# Patient Record
Sex: Female | Born: 1990 | Race: White | Marital: Married | Smoking: Never smoker
Health system: Northeastern US, Academic
[De-identification: ages and names within clinical notes are randomized; demographics above are authoritative.]

## PROBLEM LIST (undated history)

## (undated) DIAGNOSIS — D649 Anemia, unspecified: Secondary | ICD-10-CM

## (undated) DIAGNOSIS — B019 Varicella without complication: Secondary | ICD-10-CM

## (undated) HISTORY — DX: Anemia, unspecified: D64.9

## (undated) HISTORY — DX: Varicella without complication: B01.9

---

## 2010-04-09 DIAGNOSIS — Z3490 Encounter for supervision of normal pregnancy, unspecified, unspecified trimester: Secondary | ICD-10-CM

## 2010-04-09 DIAGNOSIS — O219 Vomiting of pregnancy, unspecified: Secondary | ICD-10-CM

## 2010-04-09 HISTORY — DX: Vomiting of pregnancy, unspecified: O21.9

## 2010-04-09 HISTORY — DX: Encounter for supervision of normal pregnancy, unspecified, unspecified trimester: Z34.90

## 2010-04-22 ENCOUNTER — Ambulatory Visit: Payer: Self-pay | Admitting: Obstetrics and Gynecology

## 2010-04-22 ENCOUNTER — Ambulatory Visit
Admit: 2010-04-22 | Discharge: 2010-04-22 | Disposition: A | Discharge status: Home / Self Care | Payer: Self-pay | Source: Ambulatory Visit | Attending: Obstetrics and Gynecology | Admitting: Obstetrics and Gynecology

## 2010-04-22 DIAGNOSIS — Z789 Other specified health status: Secondary | ICD-10-CM

## 2010-04-22 DIAGNOSIS — L409 Psoriasis, unspecified: Secondary | ICD-10-CM

## 2010-04-22 DIAGNOSIS — Z141 Cystic fibrosis carrier: Secondary | ICD-10-CM

## 2010-04-22 HISTORY — DX: Cystic fibrosis carrier: Z14.1

## 2010-04-22 HISTORY — DX: Psoriasis, unspecified: L40.9

## 2010-04-22 HISTORY — DX: Other specified health status: Z78.9

## 2010-04-22 LAB — TYPE AND SCREEN FOR PNP
ABO RH Blood Type: A POS
Antibody Screen: NEGATIVE

## 2010-04-22 LAB — TSH: TSH: 0.55 u[IU]/mL (ref 0.27–4.20)

## 2010-04-22 LAB — T4, FREE: Free T4: 1.4 ng/dL (ref 0.9–1.7)

## 2010-04-23 LAB — LEAD, BLOOD

## 2010-04-23 LAB — HEMOGLOBIN ELECTROPHORESIS
Hemoglobin Result: NORMAL
Hgb A1: 97.1 % (ref 96.8–97.8)
Hgb A2: 2.9 % (ref 2.2–3.2)

## 2010-04-23 LAB — DRUG SCREEN CHEMICAL DEPENDENCY, URINE
Amphetamine,UR: NEGATIVE
Benzodiazepinen,UR: NEGATIVE
Cocaine/Metab,UR: NEGATIVE
Opiates,UR: NEGATIVE
THC Metabolite,UR: NEGATIVE

## 2010-04-23 LAB — AEROBIC CULTURE: Aerobic Culture: 0

## 2010-04-23 LAB — LEAD VENOUS: Lead,Venous: <1 ug/dL (ref 0–25)

## 2010-04-23 LAB — HEPATITIS B SURFACE ANTIGEN: HBV S Ag: NEGATIVE

## 2010-04-23 LAB — HIV-1 AND 2 AB: HIV 1&2 Ab screen: NEGATIVE

## 2010-04-24 LAB — OBSTETRICS PANEL
Baso # K/uL: 0 THOU/uL (ref 0.0–0.1)
Basophil %: 0.3 % (ref 0.1–1.2)
Eos # K/uL: 0.1 THOU/uL (ref 0.0–0.4)
Eosinophil %: 0.7 % (ref 0.7–5.8)
Hematocrit: 35 % (ref 34–45)
Hemoglobin: 11.8 g/dL (ref 11.2–15.7)
Lymph # K/uL: 1.9 THOU/uL (ref 1.2–3.7)
Lymphocyte %: 26.7 % (ref 19.3–51.7)
MCV: 88 fL (ref 79–95)
Mono # K/uL: 0.6 THOU/uL (ref 0.2–0.9)
Monocyte %: 7.8 % (ref 4.7–12.5)
Neut # K/uL: 4.7 THOU/uL (ref 1.6–6.1)
Platelets: 231 THOU/uL (ref 160–370)
RBC: 4 MIL/uL (ref 3.9–5.2)
RDW: 12.4 % (ref 11.7–14.4)
RPR Screen: NONREACTIVE
Seg Neut %: 64.5 % (ref 34.0–71.1)
WBC: 7.2 THOU/uL (ref 4.0–10.0)

## 2010-04-24 LAB — TOXOPLASMA GONDII ANTIBODY, IGG: Toxoplasma IgG: NEGATIVE

## 2010-05-01 LAB — CYSTIC FIBROSIS DNA (BAYLOR)

## 2010-05-09 ENCOUNTER — Ambulatory Visit: Payer: Self-pay

## 2010-05-20 ENCOUNTER — Other Ambulatory Visit: Payer: Self-pay | Admitting: Obstetrics and Gynecology

## 2010-05-20 ENCOUNTER — Inpatient Hospital Stay: Admit: 2010-05-20 | Discharge: 2010-05-20 | Disposition: A | Discharge status: Home / Self Care | Payer: Self-pay

## 2010-05-20 ENCOUNTER — Ambulatory Visit
Admit: 2010-05-20 | Discharge: 2010-05-20 | Disposition: A | Discharge status: Home / Self Care | Payer: Self-pay | Source: Ambulatory Visit | Admitting: Obstetrics and Gynecology

## 2010-05-20 ENCOUNTER — Ambulatory Visit
Admit: 2010-05-20 | Discharge: 2010-05-20 | Disposition: A | Discharge status: Home / Self Care | Payer: Self-pay | Source: Ambulatory Visit | Attending: Obstetrics and Gynecology | Admitting: Obstetrics and Gynecology

## 2010-05-20 ENCOUNTER — Ambulatory Visit: Payer: Self-pay | Admitting: Obstetrics and Gynecology

## 2010-05-20 DIAGNOSIS — K5900 Constipation, unspecified: Secondary | ICD-10-CM

## 2010-05-20 HISTORY — DX: Constipation, unspecified: K59.00

## 2010-05-21 LAB — N. GONORRHOEAE DNA AMPLIFICATION: N. gonorrhoeae DNA Amplification: 0

## 2010-05-21 LAB — AEROBIC CULTURE: Aerobic Culture: 0

## 2010-05-21 LAB — CHLAMYDIA PLASMID DNA AMPLIFICATION: Chlamydia Plasmid DNA Amplification: 0

## 2010-05-22 LAB — MATERNAL 1ST TRIMESTER SCR (11-13 6/7 WEEKS)
Age at Delivery: 22.3 yrs.
CRL: 57.3 mm
DS A Priori Risk: 1:819 {titer}
DS Screen Risk: 1:3290 {titer}
HCG MoM: 1.55
NT MoM: 0.88
NT: 1.2 mm
PAPP-A MoM: 0.82
PAPP-A: 1.69 m[IU]/mL
Patient Weight: 116 [lb_av]
T18 A Priori Risk: 1:2040 {titer}
T18 Screen Risk: 1:97900 {titer}
hCG: 164726 m[IU]/mL

## 2010-05-24 LAB — GYN CYTOLOGY

## 2010-06-09 NOTE — L&D Delivery Note (Addendum)
Delivery Summary Note  Patient: Carrie Velazquez  Age: 20 y.o.  Date of Birth: 01-Aug-1990  ZOX:[id number]  MRN: [id number]  Admission Summary  Date and time of admission: 12/05/2010  7:25 PM Attending Provider: Dalene Seltzer, MD Provider Group : [facility]  Active Hospital Problems   Diagnoses   . Normal pregnancy     Allergies:   Review of patient's allergies indicates no known allergies.  Weight:  PrePregnancy Weight: 51.71 kg (114 lb) Weight: 67.586 kg (149 lb) Pregnancy weight change (kg): 15.88 kg   Obstetric History    G1   P1   T1   P0   A0   TAB0   SAB0   E0   M0   L1       Breast or Bottle Feeding: Breast feeding           Prenatal Labs  ABO RH Blood Type   Date Value Range Status   12/05/2010 A RH POS   Final        Antibody Screen   Date Value Range Status   12/05/2010 Negative   Final        Rubella IgG AB   Date Value Range Status   04/22/2010 EQUIVCL  IMMUNE (no units) Final      TEST METHOD: EIA        RPR Screen   Date Value Range Status   04/22/2010 NONREACT  NONREACT (no units) Final      TEST METHOD: Charcoal Particle Agglutination        HBV S Ag   Date Value Range Status   04/22/2010 NEG   Final        Group B Strep Culture   Date Value Range Status   11/08/2010 .   Final        HIV 1&2 Ab screen   Date Value Range Status   04/22/2010 NEG   Final      TEST METHOD: EIA      Dating Information  Patient's last menstrual period was 02/26/2010. EDD: 12/03/2010, by Last Menstrual Period  Labor Onset               Information for the patient's newborn:  Jaki, Reves Girl [[id number]]   Delivery:   Patient: Girl Hyacinth Meeker  Sex: female Gestational Age: 69.4 weeks. MRN: [id number] Levin Erp, MD, MD *     Birth date [birth year] Time 12:35 PM      Newborn Measurements  Weight: 3561 g (7 lb 13.6 oz)  Height: 20.5"  Head circumference: 13.5 cm  *Presentation     Presentation: Vertex Position: Right Occiput Anterior        *Delivering Personnel     Delivering clinician: Rich Reining    Other personnel:      Provider Role    Liz Malady  Delivery Nurse    Joaquin Music, JACLYN Delivery Assist    Wanita Chamberlain A Registered Nurse    Corinne Ports Delivery Assist           *Delivery (Newborn)      Delivery type: Vaginal, Spontaneous Delivery                Location of delivery: labor flr        *Anesthesia     Method: Epidural, Local         *APGARs     Totals: : 8  : 9          Color:  1  1          Grimace:      2  2          Breathing:     1  2          Heart rate:    2  2          Muscle tone: 2  2          Living Status Yes                          *Resuscitation     Method: None        *Cord Info     Vessels: 3 Vessels Complications: NONE Cord blood disposition: Lab Gases sent? No        *Placenta     Date and time: 12/06/2010 12:39 PM    Removal: Spontaneous Appearance: Intact        *Stages of Labor     Stage 1  Hours: 6 Min: 39    Stage 2  Hours: 1 Min: 5    Stage 3  Hours: 0 Min: 4        *Labor Events     Preterm labor? No    Rupture date: 12/06/10 Rupture type: Spontaneous    Rupture Time  1:30 AM Fluid color: Moderate Amount, Non Particulate Meconium    Induction: None Augmentation: Oxytocin, AROM                  *Delivery (Maternal)     Episiotomy: None Repair Done: Yes    Lacerations: Vaginal, 1st Blood loss (mL): 300    Needle Count: Correct Sponge Count: Correct             20 year old G1P1001 s/p spontaneous vaginal delivery to live female infant, birth weight 7 lb 13.6 ounces (3561g) with Apgars of 8 and 9. Patient sustained first degree vaginal laceration, repaired with 3-0 vicryl and a local anesthesia. Placenta delivered spontaneously intact with 3 vessel cord. EBL 300 mL. SCN in to assess infant for respiratory support. Patient stable.    Dr. Lyndal Rainbow and Dr.Morrison-Cole were present for the entire delivery.     Corinne Ports, MD  Ob/Gyn R1  Pager #: [phone]    MFM Attending    Patient underwent spontaneous vaginal delivery without complications.  Placenta delivered spontaneously and intact. Patient and infant  stable throughout.    I was present throughout the entire procedure.    Rich Reining, MD

## 2010-06-14 ENCOUNTER — Ambulatory Visit: Payer: Self-pay | Admitting: Obstetrics and Gynecology

## 2010-06-14 ENCOUNTER — Ambulatory Visit
Admit: 2010-06-14 | Discharge: 2010-06-14 | Disposition: A | Discharge status: Home / Self Care | Payer: Self-pay | Source: Ambulatory Visit | Attending: Obstetrics and Gynecology | Admitting: Obstetrics and Gynecology

## 2010-06-16 LAB — AEROBIC CULTURE: Aerobic Culture: 0

## 2010-06-16 NOTE — Letter (Signed)
 May 10, 2010    Percell Maberry, CNM  [address]  [place], [place]  [address]    RE:   Marla, Pouliot  DOB:  [birth year]  Unit#: [id number]    Consultation Date:       05/09/2010  Site:               [facility]  ICD9 Code:          655.23  Consultation Code:      99243  Counselor/ MD:           LM/MH    Indication:    Cystic fibrosis carrier  Pregnant?   Y                   Age (at [facility], if pregnant):   43  Gestational age at time of consultation:  [redacted]w[redacted]d    Family History:  This is the first pregnancy for Ms. Dannemiller and her partner, Donnie Coffin.  Reported family histories did not reveal significant risk factors.    "X" Indicates Test Discussed         Comments  X    First trimester screening        Planned 05/20/10.        Second trimester screening      Offer at 15-18 wks for open NTD  screening.  X    Amniocentesis*             Discussed availability should Mr. Carmela Hurt  screen positive for CF.  X    Chorionic villus sampling*     Discussed availability should Mr.  Carmela Hurt screen positive for CF.  X    Hemoglobinopathy screen           Written info provided.  Please offer  if not already done.  X    Cystic fibrosis             Elected by Mr. Carmela Hurt 05/09/10.        Ashkenazi Carrier Screen        Fragile X        Blood chromosome analysis        Ultrasound        Other  * Risks, benefits, and limitations discussed, including the risk of              miscarriage.      Comments:  Ms. 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  During the counseling session, we discussed the  common clinical features and autosomal recessive inheritance pattern of  cystic fibrosis.  Given his ancestry, there is a 1 in 34 chance that Mr.  Carmela Hurt is also a cystic fibrosis carrier and therefore approximately a one  percent chance that the current pregnancy is affected.  He elected CF  carrier screening for additional  information.  The couple was informed that  if he screens negative this will significantly reduce, but not completely  eliminate, the chance for an affected child.  If he is found to carry an  identifiable cystic fibrosis mutation, prenatal diagnosis via CVS or  amniocentesis would be possible.      Results:  Results will be forwarded to your office when available.            Thank you for referring this patient to Reproductive Genetics.  Sincerely,  Dictated by:  Demetrios Isaacs, [facility]  Electronically Reviewed and Signed by  Demetrios Isaacs, [facility] 05/10/2010 18:05    Co-signature.    Electronically Signed and Finalized by  Forrestine Him, MD 05/13/2010 17:16  ____________________________________  Forrestine Him, MD          DD:   05/09/2010  DT:   05/09/2010  1:01 P  DVI:  [id number]      cc:   Percell Cacho, CNM

## 2010-07-02 ENCOUNTER — Ambulatory Visit: Payer: Self-pay | Admitting: Obstetrics and Gynecology

## 2010-07-02 ENCOUNTER — Ambulatory Visit
Admit: 2010-07-02 | Discharge: 2010-07-02 | Disposition: A | Discharge status: Home / Self Care | Payer: Self-pay | Source: Ambulatory Visit | Attending: Obstetrics and Gynecology | Admitting: Obstetrics and Gynecology

## 2010-07-03 LAB — AEROBIC CULTURE: Aerobic Culture: 0

## 2010-07-09 ENCOUNTER — Ambulatory Visit
Admit: 2010-07-09 | Discharge: 2010-07-09 | Disposition: A | Discharge status: Home / Self Care | Payer: Self-pay | Source: Ambulatory Visit | Admitting: Obstetrics and Gynecology

## 2010-07-09 ENCOUNTER — Inpatient Hospital Stay: Admit: 2010-07-09 | Discharge: 2010-07-09 | Disposition: A | Discharge status: Home / Self Care | Payer: Self-pay

## 2010-07-09 ENCOUNTER — Other Ambulatory Visit: Payer: Self-pay | Admitting: Obstetrics and Gynecology

## 2010-07-22 ENCOUNTER — Encounter: Payer: Self-pay | Admitting: Obstetrics and Gynecology

## 2010-07-25 ENCOUNTER — Other Ambulatory Visit: Payer: Self-pay | Admitting: Gynecology

## 2010-07-25 ENCOUNTER — Ambulatory Visit
Admit: 2010-07-25 | Discharge: 2010-07-25 | Disposition: A | Discharge status: Home / Self Care | Payer: Self-pay | Source: Ambulatory Visit | Admitting: Obstetrics and Gynecology

## 2010-07-25 ENCOUNTER — Inpatient Hospital Stay: Admit: 2010-07-25 | Discharge: 2010-07-25 | Disposition: A | Discharge status: Home / Self Care | Payer: Self-pay

## 2010-08-07 ENCOUNTER — Ambulatory Visit: Payer: Self-pay | Admitting: Obstetrics & Gynecology

## 2010-08-07 DIAGNOSIS — A630 Anogenital (venereal) warts: Secondary | ICD-10-CM

## 2010-08-07 HISTORY — DX: Anogenital (venereal) warts: A63.0

## 2010-08-28 ENCOUNTER — Ambulatory Visit: Payer: Self-pay | Admitting: Student in an Organized Health Care Education/Training Program

## 2010-09-11 ENCOUNTER — Ambulatory Visit: Payer: Self-pay | Admitting: Obstetrics and Gynecology

## 2010-09-11 ENCOUNTER — Ambulatory Visit
Admit: 2010-09-11 | Discharge: 2010-09-11 | Disposition: A | Discharge status: Home / Self Care | Payer: Self-pay | Source: Ambulatory Visit | Attending: Obstetrics and Gynecology | Admitting: Obstetrics and Gynecology

## 2010-09-11 LAB — GLUCOSE TOLERANCE, 1 HOUR: Glucose,50gm 1HR: 103 mg/dL (ref 63–135)

## 2010-09-11 LAB — HEMATOCRIT: Hematocrit: 30 % — ABNORMAL LOW (ref 34–45)

## 2010-09-12 LAB — SYPHILIS SCREEN
Syphilis Screen: NEGATIVE
Syphilis Status: NONREACTIVE

## 2010-10-08 ENCOUNTER — Encounter: Payer: Self-pay | Admitting: Obstetrics and Gynecology

## 2010-10-09 ENCOUNTER — Ambulatory Visit: Payer: Self-pay | Admitting: Obstetrics and Gynecology

## 2010-10-16 ENCOUNTER — Ambulatory Visit: Payer: Self-pay

## 2010-10-16 DIAGNOSIS — L409 Psoriasis, unspecified: Secondary | ICD-10-CM | POA: Insufficient documentation

## 2010-10-16 DIAGNOSIS — Z3490 Encounter for supervision of normal pregnancy, unspecified, unspecified trimester: Secondary | ICD-10-CM | POA: Insufficient documentation

## 2010-10-16 DIAGNOSIS — Z141 Cystic fibrosis carrier: Secondary | ICD-10-CM | POA: Insufficient documentation

## 2010-10-25 ENCOUNTER — Ambulatory Visit: Payer: Self-pay | Admitting: Obstetrics and Gynecology

## 2010-10-25 ENCOUNTER — Encounter: Payer: Self-pay | Admitting: Obstetrics and Gynecology

## 2010-10-25 VITALS — BP 120/60 | Wt 136.0 lb

## 2010-10-25 DIAGNOSIS — Z3490 Encounter for supervision of normal pregnancy, unspecified, unspecified trimester: Secondary | ICD-10-CM

## 2010-10-25 NOTE — Progress Notes (Signed)
 S: Here for OB visit. Doing well. +FM denies VB, LOF or CTX. No complaints today. Not taking FeSO4 or prenatal vitamins.  O: see flow sheet  A: IUP @ 34 3/7 weeks       S=D  P: Reviewed s/sx of PTL       Encouraged to take prenatal vitamins. Reviewed low Hct (30) and importance of taking FeSO4; patient agrees to take.        Patient has not decided on pediatrician; advised to do so       Plans to breastfeed; wants POPs for PPBC       RTC in 1-2 weeks

## 2010-10-25 NOTE — Patient Instructions (Signed)
 Common Discomforts: THIRD TRIMESTER    The last few weeks of your pregnancy are often the most uncomfortable.  Your growing baby is taking up more and more space.   Carrying around all that extra weight can make you more tired than usual.  As the baby and your uterus press up, down and out, other discomforts arise.     What are the most common discomforts during this last part of my pregnancy?  Most women have some or all of the following discomforts: edema (swelling), insomnia (unable to get to sleep or stay asleep), uncomfortable intercourse (sex), going to the bathroom a lot, shortness of breath and numbness or tingling in the fingers.   Discomforts you developed in your second trimester, such as leg cramps, constipation or hemorrhoids, may continue, get better or get worse.  Every woman is different and every pregnancy is different.     What can I do to prevent or relieve any discomforts?  No matter how careful you are, it is almost impossible to avoid all discomforts during your pregnancy.  Face it--carrying around a 6-10 pound baby and all that goes with it (placenta, uterus, bag of waters) in the tight space between your rib cage and your hips is bound to be a bit of a hassle!  There are many things you can do to make these last weeks easier on yourself.  This guide looks at each of most common discomforts.     Edema  Edema is swelling of any part of your body.  Your feet, ankles and hands most commonly swell during late pregnancy.  Sometimes swelling can be a sign of a problem.  (See the guide on Warning Signs-Second Trimester).  Sudden swelling, especially in your face or upper body, can be a warning sign.  Call your health care provider (HCP) right away if you have swelling in your face or above your waist.     Swelling also happens because your hormones make your blood vessels leak.  Also, the pressure of your baby on your hips sometimes keeps your blood from flowing well in your legs.  That is why your  feet and ankles may swell.  Eating normal amounts of salt on your food will not cause this, but taking salt pills or eating a lot of salt might make edema worse.    Here are some things to do that can help:   . Try not to wear tight clothing.  Tight waistbands or knee-high and thigh-high socks are especially a problem.  . Take rest periods often. Raise your legs higher than your heart if possible.  At the very least, put your feet up so they are level with your hips.  . Use support panty hose to help the blood flow in your legs  . Watch the amount of salt and salty foods in your diet.   . Call your HCP if the swelling keeps getting worse or happens very quickly.     Insomnia    Insomnia is not being able to sleep.  Sometimes people have trouble falling asleep.  Other people fall asleep, but wake up in the middle of the night and cannot get back to sleep.  Most women have changes in their sleep patterns during pregnancy.  Sometime the size of the baby makes it hard to find a comfortable position.   Pressure on your bladder may make you have to get up to go the bathroom at night.  Stress worrying about things  and being anxious can also keep you from sleeping well.    Here are some things that might help you sleep better:  . Avoid exciting activities before bedtime.   No relay races or scary movies!  . Take a warm bath.  . Try the side-lying position for rest and relaxation.  Some women prefer to support the upper leg with pillows.  . Read a dull book.  . Have someone give you a back rub or massage!  . Exercise before dinner or at least 3 hours before bedtime.   . Take a short nap during the day so you are not overly tired.  . Try not to drink things with caffeine, like coffee, tea, cola or other soda (read the labels).  . Sip on a glass of milk before bed or have a small bowl of cereal with milk.   Marland Kitchen Keep a pad by your bed and write down things that are bothering you.  Some women cannot get to sleep for fear they will  forget what they are thinking about.     If there are things that are worrying you, talk to someone about your worries.  Your HCP might be able to give you some other ideas too!    Discomforts during sex    During this last trimester, it can be more challenging to enjoy "being with" your special someone.  Lots of things make a difference in how much you enjoy sex.   Pressure from the growing baby and changes in your vagina make sex feel different.  Your larger belly can get in the way! How you feel and think can change your feelings about sex too.  It is okay to have sex, usually all the way up until your baby's birth.   You will not hurt the baby!    In some special cases, your HCP may tell you not to have sex or even sex play especially playing with nipples (this can lead to preterm labor).  Except in these special cases, though, you can enjoy sex as much as you and your partner like.        Here are some things that can help make you more comfortable:  Marland Kitchen Try different positions.  Lying on your side of often helps.  . Use a water soluble vagina gel, like K-Y Jelly, to keep things slippery.   Do not use petroleum jelly or mineral oil.  Look for water soluble on the package.  . Talk about any fears or concerns you have with your partner or your HCP.   . Tell your HCP if you have any symptoms of a vaginal infection (itching, burning, pain with sex, strange discharge).  Be sure to follow all instructions for treatment.   . Find other ways to express your feelings.  Try new ways to give your partner pleasure if the sex act itself gets too uncomfortable.  See if there are ways he can make you feel better without sex too.    Frequent urination    You may find yourself having to go to the bathroom a lot more often now.  The baby's head is pushing your bladder (urine storage area).  Your kidneys are also making more urine these days, because you have more blood flowing through them.    Sometimes you develop a urinary  infection.   If it happens, you will also find yourself    urinating more often.  But you may also notice burning  when you go and your urine may be cloudy or smell different than usual.  If any of these things happen, tell your HCP.  There are medicines that can help get rid of the infection and make you feel better.     Things you can do to be more comfortable are:  . Drink fluids often.  Do not cut back on fluids.  Do not drink all your liquids for 1 day at the same time.  Try carrying a large cup of water with you and sip on it often.   . Do not drink liquids with caffeine in them.  Too much caffeine may not be good for the baby.  Caffeine also makes you have to urinate more often.  Coffee and tea have caffeine--even the decaffeinated kind has a little.   Colas and other dark sodas often have caffeine.  Even some "clear" sodas have caffeine, so read the labels to see if caffeine is listed in the ingredients.  . Do Kegel exercises.   Squeeze the muscles in your bottom, then release.   Try to do 10 squeezes every time you think of it.   This will make it easier for you to hold your urine until you can get to a bathroom.    Shortness of breath    A feeling of having a hard time catching your breath occurs in this last trimester.  This is caused by the baby getting bigger and pushing up on your ribs and lungs.  This makes it hard for your lungs to stretch and let you take a deep breath.  Sometimes other problems can make this feeling worse.   If you have a cold with a fever, or if you have had other problems with your heart and lungs, tell your HCP if you get short of breath.  Otherwise here are some ways to breath easier:  . Do not do anything that you find makes you short of breath!  Try not to bend over for long periods.  Pace your exercise and walking so you do not have trouble catching your breath.  Take stairs more slowly.  . Do not wear clothing that is too tight.  Make sure you get larger bras and blouses as  you chest gets larger.   . If lying down makes it harder to breathe, add extra pillows under your head and back at night.   . Split up your activities to include more rest breaks.     Numbness or tingling

## 2010-11-08 ENCOUNTER — Ambulatory Visit: Payer: Self-pay | Admitting: Obstetrics and Gynecology

## 2010-11-08 ENCOUNTER — Encounter: Payer: Self-pay | Admitting: Obstetrics and Gynecology

## 2010-11-08 VITALS — BP 110/70 | Wt 143.0 lb

## 2010-11-08 DIAGNOSIS — Z3490 Encounter for supervision of normal pregnancy, unspecified, unspecified trimester: Secondary | ICD-10-CM

## 2010-11-08 NOTE — Progress Notes (Signed)
 Subjective:      Carrie Velazquez is a 20 y.o. female being seen today for her obstetrical visit. She is at [redacted]w[redacted]d gestation. Patient reports no bleeding, no leaking and occasional contractions. Fetal movement: normal. 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    Menstrual History:  OB History     Grav Para Term Preterm Abortions TAB SAB Ect Mult Living    1                  Patient's last menstrual period was 02/26/2010.       Patient's medications, allergies, past medical, surgical, social and family histories were reviewed and updated as appropriate.    Review of Systems  A comprehensive review of systems was negative.     Objective:      BP 110/70  Wt 143 lb (64.864 kg)  LMP 02/26/2010  FHT: 143 BPM   Uterine Size: 33 cm and size less than dates   Presentations: vertex           Assessment:       Carrie Velazquez is a  20 y.o., G1P0 presenting for routine OBC at [redacted]w[redacted]d by LMP, early ultrasound with pregnancy complicated by the following obstetrical risks: anemia (Hct 30)     Plan:    Supplementation:Prenatal vitamins and Iron; Patient stated she is not currently taking iron. Advised to start taking BID as HCT is 30.  Counseling:Third trimester completed  Labs:GC, CT and GBS or GBSS  Imaging: ultrasound to check fetal growth S<D  Gender: Female  Feeding:breast  Contraception plan:OCP (estrogen/progesterone)  Follow up in 1 Week.

## 2010-11-08 NOTE — Patient Instructions (Signed)
 Signs of Labor                                 Is there any way to predict when I'm going to go into labor?  Not really. Experts don't fully understand what triggers the onset of labor, and there's no way to predict exactly when it will start. Your body actually starts preparing for labor up to a month before you give birth. You may be blissfully unaware of what's going on -- or you may begin to notice new symptoms as your due date draws near. Here are some things that may happen in the weeks or days before labor starts:    Your baby "drops."  If this is your first pregnancy, you may feel what's known as "lightening" a few weeks before labor starts. You might detect heaviness in your pelvis as this happens and notice less pressure just below your ribcage, making it easier to catch your breath.    You note more Braxton Hicks contractions.  More frequent and intense Braxton Hicks contractions can signal pre-labor, during which your cervix ripens (see below) and the stage is set for true labor. Some women experience a crampy, menstrual-like feeling during this time.  Sometimes, as true labor draws near, [facility] contractions become relatively painful and strike as often as every ten to 20 minutes, making you wonder whether true labor has started. But if the contractions don't get longer, stronger, and closer together and cause your cervix to dilate progressively, then what you're feeling is probably so-called false labor.    Your cervix starts to ripen.  In the days and weeks before delivery, Braxton Hicks contractions may do the preliminary work of softening, thinning, and perhaps opening your cervix a bit. (If you've given birth before, your cervix is more likely to dilate a centimeter or two before labor starts, but keep in mind that even being [redacted] weeks pregnant with your first baby and 1 centimeter dilated is no guarantee that labor is imminent.)  When you're at or near your due date, your practitioner may do  a vaginal exam during your prenatal visit to see whether your cervix has started to change.    You pass your mucus plug or notice "bloody show."  You may pass your mucus plug -- the small amount of thickened mucus that has sealed your cervical canal during the last nine months -- if your cervix begins to dilate as you get close to labor.    The plug may come out in a lump or as increased vaginal discharge over the course of several days. The mucus may be tinged with brown, pink, or red blood, which is why it's referred to as "bloody show." Having sex or a vaginal exam can also disturb your mucus plug and cause you to see some blood-tinged discharge, even when labor isn't going to start in the next few days.    Your water breaks.  When the fluid-filled amniotic sac surrounding your baby ruptures, fluid leaks from your vagina. And whether it comes out in a large gush or a small trickle, you should call your doctor or midwife.    Most women start having regular contractions before their water breaks, but in some cases, the water breaks first. When this happens, labor usually follows soon. If you don't start having contractions on your own within a certain amount of time, you'll need to be  induced, since your baby's more likely to get an infection without the amniotic sac's protection against germs.    How can I tell whether my labor has actually started?  It's often not possible to pinpoint exactly when "true" labor begins because early labor contractions might start out feeling like the Braxton Hicks contractions you may have been noticing for weeks.    It's likely that labor is under way, however, when your contractions become increasingly longer, stronger, and closer together. They may be as far apart as every ten minutes or so in the beginning, but they won't stop or ease up no matter what you do. And in time, they'll become more painful and closer together.    In some cases, though, the onset of strong, regular  contractions comes with little or no warning. It's different for every woman and with every pregnancy.    When should I call my doctor or midwife?   Toward the end of your pregnancy, your practitioner should give you a clear set of guidelines for when to let her know that you're having contractions and at what point she'll want you to go to the hospital or birth center.  These instructions will depend on your individual situation -- whether you have pregnancy complications or are otherwise considered high-risk, whether this is your first baby, and practical matters like how far you live from the hospital or birth center -- as well as on your caregiver's personal preference (some prefer an early heads-up).  If your pregnancy is uncomplicated, she'll probably have you wait to come in until you've been having contractions that last for about a minute each, coming every five minutes for about an hour. (You time a contraction from the beginning of one to the beginning of the next one.) As a rule, if you're high-risk, she'll want to hear from you earlier in labor.  Don't be afraid to call if the signs aren't clear but you think the time may have come. Doctors and midwives are used to getting calls from women who aren't sure whether they're in labor and need guidance. It's part of their job.  And the truth is, your caregiver can tell a lot by the sound of your voice, so verbal communication helps. She'll want to know how close together your contractions are, how long each one lasts, how strong they are (she'll note whether you can talk through a contraction), and any other symptoms you may have.  Finally, whether or not your pregnancy has been problem-free up to now, and whether or not you think you might be in labor, be sure to call your caregiver right away (and if you can't reach her, head for the hospital) in the following situations:     Your water breaks or you suspect that you're leaking amniotic fluid. Tell your  practitioner if it's yellow, brown, or greenish, because this signals the presence of meconium, your baby's first stool, which is sometimes a sign of fetal stress. It's also important to let her know if the fluid looks bloody.     You notice that your baby is less active.     You have vaginal bleeding (unless it's just bloody show -- mucus with a spot or streak of blood), constant severe abdominal pain, or fever.     You start having contractions before 37 weeks or any other signs of labor. You have severe or persistent headaches, vision changes, intense pain or tenderness in your upper abdomen, abnormal swelling, or  any other symptoms of preeclampsia.    Some women assume that various symptoms are just part and parcel of being pregnant, while others worry that every new symptom spells trouble. Knowing which pregnancy symptoms you should never ignore can help you decide when to call your caregiver.    That said, every pregnancy is different and no list can cover all situations, so if you're not sure whether a symptom is serious, or if you just don't feel like yourself or are uneasy, trust your instincts and call your healthcare provider. If there's a problem, you'll get help. If nothing's wrong, you'll be reassured.    Reviewed 03/2010

## 2010-11-11 LAB — CHLAMYDIA PLASMID DNA AMPLIFICATION: Chlamydia Plasmid DNA Amplification: 0

## 2010-11-11 LAB — N. GONORRHOEAE DNA AMPLIFICATION: N. gonorrhoeae DNA Amplification: 0

## 2010-11-11 LAB — GROUP B STREP CULTURE: Group B Strep Culture: 0

## 2010-11-12 ENCOUNTER — Ambulatory Visit
Admit: 2010-11-12 | Discharge: 2010-11-12 | Disposition: A | Discharge status: Home / Self Care | Payer: Self-pay | Source: Ambulatory Visit | Admitting: Obstetrics and Gynecology

## 2010-11-12 ENCOUNTER — Other Ambulatory Visit: Payer: Self-pay | Admitting: Obstetrics and Gynecology

## 2010-11-12 ENCOUNTER — Ambulatory Visit: Payer: Self-pay

## 2010-11-12 NOTE — Progress Notes (Signed)
 Quick Note:    GC/CT & GBS negative  ______

## 2010-11-14 ENCOUNTER — Encounter: Payer: Self-pay | Admitting: Obstetrics and Gynecology

## 2010-11-14 NOTE — Progress Notes (Signed)
 U/s in all appears normal, except hc 5th percentile, vtx, 49 percentile overall growth  vtx  Nl AFI  Will route to Dr Ferne Coe for recommendations for f/u of hc, fyi pt is a cf carrier

## 2010-11-15 ENCOUNTER — Ambulatory Visit: Payer: Self-pay | Admitting: Obstetrics and Gynecology

## 2010-11-15 ENCOUNTER — Encounter: Payer: Self-pay | Admitting: Obstetrics and Gynecology

## 2010-11-15 VITALS — BP 90/60 | Wt 148.5 lb

## 2010-11-15 DIAGNOSIS — Z3490 Encounter for supervision of normal pregnancy, unspecified, unspecified trimester: Secondary | ICD-10-CM

## 2010-11-15 NOTE — Patient Instructions (Signed)
 Signs of Labor                                 Is there any way to predict when I'm going to go into labor?  Not really. Experts don't fully understand what triggers the onset of labor, and there's no way to predict exactly when it will start. Your body actually starts preparing for labor up to a month before you give birth. You may be blissfully unaware of what's going on -- or you may begin to notice new symptoms as your due date draws near. Here are some things that may happen in the weeks or days before labor starts:    Your baby "drops."  If this is your first pregnancy, you may feel what's known as "lightening" a few weeks before labor starts. You might detect heaviness in your pelvis as this happens and notice less pressure just below your ribcage, making it easier to catch your breath.    You note more Braxton Hicks contractions.  More frequent and intense Braxton Hicks contractions can signal pre-labor, during which your cervix ripens (see below) and the stage is set for true labor. Some women experience a crampy, menstrual-like feeling during this time.  Sometimes, as true labor draws near, [facility] contractions become relatively painful and strike as often as every ten to 20 minutes, making you wonder whether true labor has started. But if the contractions don't get longer, stronger, and closer together and cause your cervix to dilate progressively, then what you're feeling is probably so-called false labor.    Your cervix starts to ripen.  In the days and weeks before delivery, Braxton Hicks contractions may do the preliminary work of softening, thinning, and perhaps opening your cervix a bit. (If you've given birth before, your cervix is more likely to dilate a centimeter or two before labor starts, but keep in mind that even being [redacted] weeks pregnant with your first baby and 1 centimeter dilated is no guarantee that labor is imminent.)  When you're at or near your due date, your practitioner may do  a vaginal exam during your prenatal visit to see whether your cervix has started to change.    You pass your mucus plug or notice "bloody show."  You may pass your mucus plug -- the small amount of thickened mucus that has sealed your cervical canal during the last nine months -- if your cervix begins to dilate as you get close to labor.    The plug may come out in a lump or as increased vaginal discharge over the course of several days. The mucus may be tinged with brown, pink, or red blood, which is why it's referred to as "bloody show." Having sex or a vaginal exam can also disturb your mucus plug and cause you to see some blood-tinged discharge, even when labor isn't going to start in the next few days.    Your water breaks.  When the fluid-filled amniotic sac surrounding your baby ruptures, fluid leaks from your vagina. And whether it comes out in a large gush or a small trickle, you should call your doctor or midwife.    Most women start having regular contractions before their water breaks, but in some cases, the water breaks first. When this happens, labor usually follows soon. If you don't start having contractions on your own within a certain amount of time, you'll need to be  induced, since your baby's more likely to get an infection without the amniotic sac's protection against germs.    How can I tell whether my labor has actually started?  It's often not possible to pinpoint exactly when "true" labor begins because early labor contractions might start out feeling like the Braxton Hicks contractions you may have been noticing for weeks.    It's likely that labor is under way, however, when your contractions become increasingly longer, stronger, and closer together. They may be as far apart as every ten minutes or so in the beginning, but they won't stop or ease up no matter what you do. And in time, they'll become more painful and closer together.    In some cases, though, the onset of strong, regular  contractions comes with little or no warning. It's different for every woman and with every pregnancy.    When should I call my doctor or midwife?   Toward the end of your pregnancy, your practitioner should give you a clear set of guidelines for when to let her know that you're having contractions and at what point she'll want you to go to the hospital or birth center.  These instructions will depend on your individual situation -- whether you have pregnancy complications or are otherwise considered high-risk, whether this is your first baby, and practical matters like how far you live from the hospital or birth center -- as well as on your caregiver's personal preference (some prefer an early heads-up).  If your pregnancy is uncomplicated, she'll probably have you wait to come in until you've been having contractions that last for about a minute each, coming every five minutes for about an hour. (You time a contraction from the beginning of one to the beginning of the next one.) As a rule, if you're high-risk, she'll want to hear from you earlier in labor.  Don't be afraid to call if the signs aren't clear but you think the time may have come. Doctors and midwives are used to getting calls from women who aren't sure whether they're in labor and need guidance. It's part of their job.  And the truth is, your caregiver can tell a lot by the sound of your voice, so verbal communication helps. She'll want to know how close together your contractions are, how long each one lasts, how strong they are (she'll note whether you can talk through a contraction), and any other symptoms you may have.  Finally, whether or not your pregnancy has been problem-free up to now, and whether or not you think you might be in labor, be sure to call your caregiver right away (and if you can't reach her, head for the hospital) in the following situations:     Your water breaks or you suspect that you're leaking amniotic fluid. Tell your  practitioner if it's yellow, brown, or greenish, because this signals the presence of meconium, your baby's first stool, which is sometimes a sign of fetal stress. It's also important to let her know if the fluid looks bloody.     You notice that your baby is less active.     You have vaginal bleeding (unless it's just bloody show -- mucus with a spot or streak of blood), constant severe abdominal pain, or fever.     You start having contractions before 37 weeks or any other signs of labor. You have severe or persistent headaches, vision changes, intense pain or tenderness in your upper abdomen, abnormal swelling, or  any other symptoms of preeclampsia.    Some women assume that various symptoms are just part and parcel of being pregnant, while others worry that every new symptom spells trouble. Knowing which pregnancy symptoms you should never ignore can help you decide when to call your caregiver.    That said, every pregnancy is different and no list can cover all situations, so if you're not sure whether a symptom is serious, or if you just don't feel like yourself or are uneasy, trust your instincts and call your healthcare provider. If there's a problem, you'll get help. If nothing's wrong, you'll be reassured.    Reviewed 03/2010

## 2010-11-15 NOTE — Progress Notes (Signed)
 Subjective:      Jasmina Gendron is a 20 y.o. female being seen today for her obstetrical visit. She is at [redacted]w[redacted]d gestation. Patient reports contractions since 12:00pm today, no bleeding and no leaking. Fetal movement: normal.    Menstrual History:  OB History     Grav Para Term Preterm Abortions TAB SAB Ect Mult Living    1                  Patient's last menstrual period was 02/26/2010.       Patient's medications, allergies, past medical, surgical, social and family histories were reviewed and updated as appropriate.    Review of Systems  A comprehensive review of systems was negative except for: painful uterine contraction. Patient states she has been unable to time them because she didnt have a watch.     Objective:      BP 90/60  Wt 148 lb 8 oz (67.359 kg)  LMP 02/26/2010  FHT: 155 BPM   Uterine Size: 36 cm and size equals dates   Presentations: cephalic         Filed Vitals:    11/15/10 1308   BP: 90/60   Weight: 148 lb 8 oz (67.359 kg)   Unable to tolerate speculum exam.  Cervix: 1cm, 40%, -3    Assessment:       Verma Grothaus is a  20 y.o., G1P0 presenting for routine OBC at [redacted]w[redacted]d by LMP, early ultrasound with pregnancy complicated by the following obstetrical risks: nothing    Painful uterine contractions, possible early labor      Plan:    Supplementation:Prenatal vitamins  Reviewed s/sx of labor and labor instructions  Encouraged to time contractions  Encouraged adequate hydration  Reviewed GBS status (negative)  Follow up in 1 Week.

## 2010-11-20 ENCOUNTER — Ambulatory Visit: Payer: Self-pay | Admitting: Obstetrics and Gynecology

## 2010-11-20 DIAGNOSIS — Z141 Cystic fibrosis carrier: Secondary | ICD-10-CM

## 2010-11-20 DIAGNOSIS — Z789 Other specified health status: Secondary | ICD-10-CM

## 2010-11-20 DIAGNOSIS — Z3490 Encounter for supervision of normal pregnancy, unspecified, unspecified trimester: Secondary | ICD-10-CM

## 2010-11-20 NOTE — Patient Instructions (Signed)
Signs of Labor                                 Is there any way to predict when I'm going to go into labor?  Not really. Experts don't fully understand what triggers the onset of labor, and there's no way to predict exactly when it will start. Your body actually starts preparing for labor up to a month before you give birth. You may be blissfully unaware of what's going on -- or you may begin to notice new symptoms as your due date draws near. Here are some things that may happen in the weeks or days before labor starts:    Your baby "drops."  If this is your first pregnancy, you may feel what's known as "lightening" a few weeks before labor starts. You might detect heaviness in your pelvis as this happens and notice less pressure just below your ribcage, making it easier to catch your breath.    You note more Braxton Hicks contractions.  More frequent and intense Braxton Hicks contractions can signal pre-labor, during which your cervix ripens (see below) and the stage is set for true labor. Some women experience a crampy, menstrual-like feeling during this time.  Sometimes, as true labor draws near, Braxton Hicks contractions become relatively painful and strike as often as every ten to 20 minutes, making you wonder whether true labor has started. But if the contractions don't get longer, stronger, and closer together and cause your cervix to dilate progressively, then what you're feeling is probably so-called false labor.    Your cervix starts to ripen.  In the days and weeks before delivery, Braxton Hicks contractions may do the preliminary work of softening, thinning, and perhaps opening your cervix a bit. (If you've given birth before, your cervix is more likely to dilate a centimeter or two before labor starts, but keep in mind that even being [redacted] weeks pregnant with your first baby and 1 centimeter dilated is no guarantee that labor is imminent.)  When you're at or near your due date, your practitioner may do  a vaginal exam during your prenatal visit to see whether your cervix has started to change.    You pass your mucus plug or notice "bloody show."  You may pass your mucus plug -- the small amount of thickened mucus that has sealed your cervical canal during the last nine months -- if your cervix begins to dilate as you get close to labor.    The plug may come out in a lump or as increased vaginal discharge over the course of several days. The mucus may be tinged with brown, pink, or red blood, which is why it's referred to as "bloody show." Having sex or a vaginal exam can also disturb your mucus plug and cause you to see some blood-tinged discharge, even when labor isn't going to start in the next few days.    Your water breaks.  When the fluid-filled amniotic sac surrounding your baby ruptures, fluid leaks from your vagina. And whether it comes out in a large gush or a small trickle, you should call your doctor or midwife.    Most women start having regular contractions before their water breaks, but in some cases, the water breaks first. When this happens, labor usually follows soon. If you don't start having contractions on your own within a certain amount of time, you'll need to be   induced, since your baby's more likely to get an infection without the amniotic sac's protection against germs.    How can I tell whether my labor has actually started?  It's often not possible to pinpoint exactly when "true" labor begins because early labor contractions might start out feeling like the Braxton Hicks contractions you may have been noticing for weeks.    It's likely that labor is under way, however, when your contractions become increasingly longer, stronger, and closer together. They may be as far apart as every ten minutes or so in the beginning, but they won't stop or ease up no matter what you do. And in time, they'll become more painful and closer together.    In some cases, though, the onset of strong, regular  contractions comes with little or no warning. It's different for every woman and with every pregnancy.    When should I call my doctor or midwife?  • Toward the end of your pregnancy, your practitioner should give you a clear set of guidelines for when to let her know that you're having contractions and at what point she'll want you to go to the hospital or birth center.  These instructions will depend on your individual situation -- whether you have pregnancy complications or are otherwise considered high-risk, whether this is your first baby, and practical matters like how far you live from the hospital or birth center -- as well as on your caregiver's personal preference (some prefer an early heads-up).  If your pregnancy is uncomplicated, she'll probably have you wait to come in until you've been having contractions that last for about a minute each, coming every five minutes for about an hour. (You time a contraction from the beginning of one to the beginning of the next one.) As a rule, if you're high-risk, she'll want to hear from you earlier in labor.  Don't be afraid to call if the signs aren't clear but you think the time may have come. Doctors and midwives are used to getting calls from women who aren't sure whether they're in labor and need guidance. It's part of their job.  And the truth is, your caregiver can tell a lot by the sound of your voice, so verbal communication helps. She'll want to know how close together your contractions are, how long each one lasts, how strong they are (she'll note whether you can talk through a contraction), and any other symptoms you may have.  Finally, whether or not your pregnancy has been problem-free up to now, and whether or not you think you might be in labor, be sure to call your caregiver right away (and if you can't reach her, head for the hospital) in the following situations:    • Your water breaks or you suspect that you're leaking amniotic fluid. Tell your  practitioner if it's yellow, brown, or greenish, because this signals the presence of meconium, your baby's first stool, which is sometimes a sign of fetal stress. It's also important to let her know if the fluid looks bloody.    • You notice that your baby is less active.    • You have vaginal bleeding (unless it's just bloody show -- mucus with a spot or streak of blood), constant severe abdominal pain, or fever.    • You start having contractions before 37 weeks or any other signs of labor. You have severe or persistent headaches, vision changes, intense pain or tenderness in your upper abdomen, abnormal swelling, or   any other symptoms of preeclampsia.    Some women assume that various symptoms are just part and parcel of being pregnant, while others worry that every new symptom spells trouble. Knowing which pregnancy symptoms you should never ignore can help you decide when to call your caregiver.    That said, every pregnancy is different and no list can cover all situations, so if you're not sure whether a symptom is serious, or if you just don't feel like yourself or are uneasy, trust your instincts and call your healthcare provider. If there's a problem, you'll get help. If nothing's wrong, you'll be reassured.    Reviewed 03/2010

## 2010-11-20 NOTE — Progress Notes (Signed)
Subjective:      Carrie Velazquez is a 20 y.o.G1P0 female being seen today for her obstetrical visit at [redacted]w[redacted]d.  Patient reports no bleeding, no leaking and some pains but slept them off last night. Fetal movement: normal. She feels like she is ready to be done.    Patient's medications, allergies, past medical, surgical, social and family histories were reviewed and updated as appropriate.    Pregnancy Risks:  CF carrier, FOB negative  Rubella non-immune  Unplanned pregnancy    Review of Systems  Pertinent items are noted in HPI.     Objective:      BP 110/62  Wt 146 lb 8 oz (66.452 kg)  LMP 02/26/2010  FHT:  155 BPM   Uterine Size: 37 cm   Presentation: cephalic      Assessment:     20 y.o.G1P0 at [redacted]w[redacted]d who presents for routine OB visit.    Plan:     Infant feeding: plans to breastfeed.  Postpartum birth control plan: undecided  GBS negative  Female infant  Labor precautions reviewed  Follow up in 1 Week.    Marla Roe, MD

## 2010-11-21 ENCOUNTER — Encounter: Payer: Self-pay | Admitting: Obstetrics and Gynecology

## 2010-11-21 NOTE — Progress Notes (Signed)
Reviewed 11/12/10 ultrasound.  HC 5%ile, but BPD, FL, AC are appropriate with average EFW.  No need for further follow-up ultrasound unless clinically indicated.  Louie Bun, MD

## 2010-11-26 ENCOUNTER — Ambulatory Visit: Payer: Self-pay | Admitting: Obstetrics and Gynecology

## 2010-11-26 VITALS — BP 110/64 | Wt 146.0 lb

## 2010-11-26 DIAGNOSIS — Z3490 Encounter for supervision of normal pregnancy, unspecified, unspecified trimester: Secondary | ICD-10-CM

## 2010-11-26 DIAGNOSIS — Z3400 Encounter for supervision of normal first pregnancy, unspecified trimester: Secondary | ICD-10-CM

## 2010-11-26 NOTE — Progress Notes (Signed)
PH:7    Leukocytes:+1    Nitrates:Neg    Protein:Trace    Glucose:Normal    Ketone:Neg    Blood: Neg  Carrie Velazquez

## 2010-11-26 NOTE — Patient Instructions (Addendum)
Breast Feeding?      Birth Control Options    Depo-Provera   • Depo Provera is a form of contraception injected into a muscle once every three months.   • It is very effective, with a failure (pregnancy) rate of less than one percent when the injection is given on time.   • Because Depo Provera is long-acting, it may not be ideal for women who wish to become pregnant shortly after stopping the medication.    ? Side Effects:   • Irregular or prolonged bleeding   • Some women completely stop having menstrual periods after one year of use     Implanon  • Implanon is a single-rod progestin implant placed under the skin of your upper arm.  • It is effective for up to 3 years, but can be removed if pregnancy is desired sooner.    ? Side Effects:   • Irregular bleeding  • Temporary insertion pain    Paragard   • Paragard is an Intrauterine Device (IUD) inserted by a doctor or nurse into the uterus.  • Paragard lasts for at least 10 years and the pregnancy rate is less than 1% within the first year of use.  • Paragard is ideal for women who do not plan to become pregnant for at least 1 year or longer.   • Paragard does not affect your chance of becoming pregnant once it is removed.    ? Side Effects:   • Heavier or longer menstrual periods  • There is a small risk that the IUD will come out during your period (use a backup method until your doctor or nurse can see you)    Progestin-Only Pills (minipills)  • Mini-pills are over the counter progestin-only pills used for contraception  • Minipills should be taken at the same time each day to maximize effectiveness    ? Side Effects:   • Unscheduled bleeding, spotting, or amenorrhea   • Acne may flare  • Follicular cysts may occur  Reviewed 03/2010

## 2010-11-26 NOTE — Progress Notes (Signed)
Subjective:      Carrie Velazquez is a 20 y.o.G1P0 female being seen today for her obstetrical visit at [redacted]w[redacted]d.  Patient reports backache, fatigue, no bleeding, no contractions, no cramping and no leaking. Fetal movement: normal. Did have some vomiting once on Friday and leaked some fluid then but no further leaking and wonders if it what urine. Looked down there with a mirror and saw something coming out that didn't look normal, hasn't looked since. Nothing that she has noticed when she is wiping.  Wants to be done.  Ready for baby.    Patient's medications, allergies, past medical, surgical, social and family histories were reviewed and updated as appropriate.    Pregnancy Risks:  Unplanned pregnancy  CF carrier, FOB negative      Review of Systems  Pertinent items are noted in HPI.     Objective:      BP 110/64  Wt 66.225 kg (146 lb)  LMP 02/26/2010  FHT:  155 BPM   Uterine Size: 39 cm   SVE: Normal perineal exam, cervix closed but vertex low at 0 to -1.    Presentation: cephalic      Assessment:     20 y.o.G1P0 at [redacted]w[redacted]d who presents for routine OB visit.    Plan:     Pediatrician: selected.  Infant feeding: plans to breastfeed.  GBS negative  Plans on breast feeding  Female infant  Postpartum birth control plan:Minipill  Follow up in 1 Week.

## 2010-12-02 ENCOUNTER — Encounter: Payer: Self-pay | Admitting: Obstetrics and Gynecology

## 2010-12-02 ENCOUNTER — Ambulatory Visit: Payer: Self-pay | Admitting: Obstetrics and Gynecology

## 2010-12-02 VITALS — BP 100/58 | Wt 149.0 lb

## 2010-12-02 DIAGNOSIS — Z3490 Encounter for supervision of normal pregnancy, unspecified, unspecified trimester: Secondary | ICD-10-CM

## 2010-12-02 NOTE — Patient Instructions (Signed)
Signs of Labor                                 Is there any way to predict when I'm going to go into labor?  Not really. Experts don't fully understand what triggers the onset of labor, and there's no way to predict exactly when it will start. Your body actually starts preparing for labor up to a month before you give birth. You may be blissfully unaware of what's going on -- or you may begin to notice new symptoms as your due date draws near. Here are some things that may happen in the weeks or days before labor starts:    Your baby "drops."  If this is your first pregnancy, you may feel what's known as "lightening" a few weeks before labor starts. You might detect heaviness in your pelvis as this happens and notice less pressure just below your ribcage, making it easier to catch your breath.    You note more Braxton Hicks contractions.  More frequent and intense Braxton Hicks contractions can signal pre-labor, during which your cervix ripens (see below) and the stage is set for true labor. Some women experience a crampy, menstrual-like feeling during this time.  Sometimes, as true labor draws near, Braxton Hicks contractions become relatively painful and strike as often as every ten to 20 minutes, making you wonder whether true labor has started. But if the contractions don't get longer, stronger, and closer together and cause your cervix to dilate progressively, then what you're feeling is probably so-called false labor.    Your cervix starts to ripen.  In the days and weeks before delivery, Braxton Hicks contractions may do the preliminary work of softening, thinning, and perhaps opening your cervix a bit. (If you've given birth before, your cervix is more likely to dilate a centimeter or two before labor starts, but keep in mind that even being [redacted] weeks pregnant with your first baby and 1 centimeter dilated is no guarantee that labor is imminent.)  When you're at or near your due date, your practitioner may do  a vaginal exam during your prenatal visit to see whether your cervix has started to change.    You pass your mucus plug or notice "bloody show."  You may pass your mucus plug -- the small amount of thickened mucus that has sealed your cervical canal during the last nine months -- if your cervix begins to dilate as you get close to labor.    The plug may come out in a lump or as increased vaginal discharge over the course of several days. The mucus may be tinged with brown, pink, or red blood, which is why it's referred to as "bloody show." Having sex or a vaginal exam can also disturb your mucus plug and cause you to see some blood-tinged discharge, even when labor isn't going to start in the next few days.    Your water breaks.  When the fluid-filled amniotic sac surrounding your baby ruptures, fluid leaks from your vagina. And whether it comes out in a large gush or a small trickle, you should call your doctor or midwife.    Most women start having regular contractions before their water breaks, but in some cases, the water breaks first. When this happens, labor usually follows soon. If you don't start having contractions on your own within a certain amount of time, you'll need to be   induced, since your baby's more likely to get an infection without the amniotic sac's protection against germs.    How can I tell whether my labor has actually started?  It's often not possible to pinpoint exactly when "true" labor begins because early labor contractions might start out feeling like the Braxton Hicks contractions you may have been noticing for weeks.    It's likely that labor is under way, however, when your contractions become increasingly longer, stronger, and closer together. They may be as far apart as every ten minutes or so in the beginning, but they won't stop or ease up no matter what you do. And in time, they'll become more painful and closer together.    In some cases, though, the onset of strong, regular  contractions comes with little or no warning. It's different for every woman and with every pregnancy.    When should I call my doctor or midwife?  • Toward the end of your pregnancy, your practitioner should give you a clear set of guidelines for when to let her know that you're having contractions and at what point she'll want you to go to the hospital or birth center.  These instructions will depend on your individual situation -- whether you have pregnancy complications or are otherwise considered high-risk, whether this is your first baby, and practical matters like how far you live from the hospital or birth center -- as well as on your caregiver's personal preference (some prefer an early heads-up).  If your pregnancy is uncomplicated, she'll probably have you wait to come in until you've been having contractions that last for about a minute each, coming every five minutes for about an hour. (You time a contraction from the beginning of one to the beginning of the next one.) As a rule, if you're high-risk, she'll want to hear from you earlier in labor.  Don't be afraid to call if the signs aren't clear but you think the time may have come. Doctors and midwives are used to getting calls from women who aren't sure whether they're in labor and need guidance. It's part of their job.  And the truth is, your caregiver can tell a lot by the sound of your voice, so verbal communication helps. She'll want to know how close together your contractions are, how long each one lasts, how strong they are (she'll note whether you can talk through a contraction), and any other symptoms you may have.  Finally, whether or not your pregnancy has been problem-free up to now, and whether or not you think you might be in labor, be sure to call your caregiver right away (and if you can't reach her, head for the hospital) in the following situations:    • Your water breaks or you suspect that you're leaking amniotic fluid. Tell your  practitioner if it's yellow, brown, or greenish, because this signals the presence of meconium, your baby's first stool, which is sometimes a sign of fetal stress. It's also important to let her know if the fluid looks bloody.    • You notice that your baby is less active.    • You have vaginal bleeding (unless it's just bloody show -- mucus with a spot or streak of blood), constant severe abdominal pain, or fever.    • You start having contractions before 37 weeks or any other signs of labor. You have severe or persistent headaches, vision changes, intense pain or tenderness in your upper abdomen, abnormal swelling, or   any other symptoms of preeclampsia.    Some women assume that various symptoms are just part and parcel of being pregnant, while others worry that every new symptom spells trouble. Knowing which pregnancy symptoms you should never ignore can help you decide when to call your caregiver.    That said, every pregnancy is different and no list can cover all situations, so if you're not sure whether a symptom is serious, or if you just don't feel like yourself or are uneasy, trust your instincts and call your healthcare provider. If there's a problem, you'll get help. If nothing's wrong, you'll be reassured.    Reviewed 03/2010

## 2010-12-02 NOTE — Progress Notes (Signed)
Subjective:      Carrie Velazquez is a 20 y.o.G1P0 female being seen today for her obstetrical visit at [redacted]w[redacted]d.  Patient reports no bleeding, no contractions, no cramping and no leaking. Fetal movement: normal.     Patient's medications, allergies, past medical, surgical, social and family histories were reviewed and updated as appropriate.    Pregnancy Risks:  Genital warts  Rubella non-immune    Review of Systems  A comprehensive review of systems was negative.     Objective:      BP 100/58  Wt 149 lb (67.586 kg)  LMP 02/26/2010  FHT:  150 BPM   Uterine Size: 38 cm   Presentation: cephalic      Assessment:     20 y.o.G1P0 at [redacted]w[redacted]d who presents for routine OB visit.    Plan:      Reviewed s/sx of labor & labor instructions  U/S & NSt for post dates late this week or early next week  RTC in 1 week

## 2010-12-02 NOTE — Progress Notes (Signed)
PH:5    Leukocytes:NEG     Nitrates:NEG    Protein:TRACE    Glucose:NEG    Ketone:NEG    Blood: NEG

## 2010-12-04 ENCOUNTER — Encounter: Payer: Self-pay | Admitting: Advanced Practice Midwife

## 2010-12-04 ENCOUNTER — Observation Stay
Admit: 2010-12-04 | Disposition: A | Discharge status: Home / Self Care | Payer: Self-pay | Source: Ambulatory Visit | Attending: Advanced Practice Midwife | Admitting: Advanced Practice Midwife

## 2010-12-04 NOTE — OB Triage Note (Signed)
OB TRIAGE NOTE:    Chief Complaint: Possible leakage of fluid. R/O ROM      History of Present Ilness:   20 y.o. G1P0 at [redacted]w[redacted]d presents to triage with complaints of  several episodes of leaking a small amount of clear fluid starting around 5pm this evening. She denies blood or mucus.  She denies pain or contractions though she does note some LUQ discomfort that she feels is d/t the baby's position.       PREGNANCY RISKS:  No known.    Vital Signs:   Filed Vitals:    12/04/10 2307   BP: 119/71   Pulse: 84   Temp: 36.9 C (98.4 F)   Resp: 20   Height: 1.6 m (5\' 3" )   Weight: 67.586 kg (149 lb)         Physical Exam:  HEENT: Normocephalic; atraumatic  Cardiovascular: Regular rate and rhythm with no murmurs  Respiratory: Clear to auscultate  Abdomen: Gravid non-tender  Extremities/Skin: No edema noted    Pelvic Exam:   Dilation: 0.5cm cm   Effacement: 30%%   Station: -2    Sterile Speculum Exam:  Pooling: negative  Nitrazine: negative  Ferning: negative  Membranes:  intact      EFM:  Baseline: 140  bpm       Variability:  moderate    Accels: yes   Decels: none    Cat:  Category I       Toco: irregular, every 5-8 minutes                Labor Assessment: Not in labor    Labs:  No results found for this basename: WBC,HGB,HCT,PLT in the last 168 hours    Assessment and Plan:  20 y.o. G1P0 at [redacted]w[redacted]d with  - NST: reactive  - Keep U/S apt tomorrow and follow up with regular provider      D/W - Luther Bradley. Alaja Goldinger, D.O. - PGY1

## 2010-12-04 NOTE — Discharge Instructions (Signed)
OB Triage Discharge Summary    Reason for Triage Visit: Rule out ROM  Destination: Home  Mode: Ambulatory    Procedures done in triage: Fetal monitoring, Sterile speculum exam and Cervical exam  Pending Laboratory Data: None    Diagnosis: Membranes intact    Diet: Regular    Activity: Regular activity    Special Instructions: none    Call your OB provider or Midwife promptly if you experience any of the following symptoms:     Contractions (more than 4 in an hour), rupture of membranes, decreased fetal movement, vaginal bleeding, headache, vision changes, or right upper quadrant pain.    If you cannot reach your OB/Gyn or Midwife, call the Labor and Delivery Unit [phone]) or 911 if it is an emergency.    Follow-up with your [facility] provider as previously instructed.

## 2010-12-04 NOTE — Progress Notes (Signed)
Pt taken off the monitor per Dr. Sabra Heck. Juanetta Gosling, RN

## 2010-12-05 ENCOUNTER — Ambulatory Visit: Payer: Self-pay

## 2010-12-05 ENCOUNTER — Encounter: Payer: Self-pay | Admitting: Obstetrics and Gynecology

## 2010-12-05 ENCOUNTER — Inpatient Hospital Stay
Admit: 2010-12-05 | Disposition: A | Discharge status: Home / Self Care | Payer: Self-pay | Source: Ambulatory Visit | Attending: Obstetrics and Gynecology | Admitting: Obstetrics and Gynecology

## 2010-12-05 ENCOUNTER — Other Ambulatory Visit: Payer: Self-pay | Admitting: Obstetrics and Gynecology

## 2010-12-05 DIAGNOSIS — N898 Other specified noninflammatory disorders of vagina: Secondary | ICD-10-CM

## 2010-12-05 DIAGNOSIS — Z3490 Encounter for supervision of normal pregnancy, unspecified, unspecified trimester: Secondary | ICD-10-CM

## 2010-12-05 LAB — CBC
Hematocrit: 30 % — ABNORMAL LOW (ref 34–45)
Hemoglobin: 9.9 g/dL — ABNORMAL LOW (ref 11.2–15.7)
MCV: 85 fL (ref 79–95)
Platelets: 220 10*3/uL (ref 160–370)
RBC: 3.5 MIL/uL — ABNORMAL LOW (ref 3.9–5.2)
RDW: 13.8 % (ref 11.7–14.4)
WBC: 9.8 10*3/uL (ref 4.0–10.0)

## 2010-12-05 LAB — TYPE AND SCREEN
ABO RH Blood Type: A POS
Antibody Screen: NEGATIVE

## 2010-12-05 LAB — HOLD SST

## 2010-12-05 MED ORDER — LACTATED RINGERS IV SOLN *I*
150.0000 mL/h | INTRAVENOUS | Status: DC
Start: 2010-12-05 — End: 2010-12-06

## 2010-12-05 NOTE — Progress Notes (Addendum)
OB Admission History and Physical    CC: Labor     HPI: 20 y.o. G1P0 at [redacted]w[redacted]d by LMP consistent with an eleven wk ultrasound presents with an increase in the strength and frequency of contractions that began this AM and are now every 3-4 minutes. She has positive fetal movement. Pt reports some mucus discharge, however she does not have leakage of fluid or bleeding.     Pregnancy Risks:  1) CF Carrier (FOB neg)  2) Rubella Non-immune  3) Genital Warts    Obstetrical History:  OB History     Grav Para Term Preterm Abortions TAB SAB Ect Mult Living    1                # Outc Date GA Lbr Len/2nd Wgt Sex Del Anes PTL Lv    1 CUR                   Past Medical History:  Past Medical History   Diagnosis Date   . Unplanned pregnancy 11/11     accepted   . Nausea/vomiting in pregnancy 11/11     +ketones, to try on Unisom   . Rubella non-immune status 04/22/10     PP vaccine needed   . 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 Constipation in pregnancy 05/20/10     Colace 100mg  1 po BID   . Genital warts complicating pregnancy 08/07/10     Pt aware of dx, but bx not recommended until PP, condom use encouraged to decrease risk to partner   . Psoriasis 04/22/10     per pt hx   . Anemia HCT of 30 4/12        Past Surgical History:  History reviewed. No pertinent past surgical history.     Home Medications:  See Med Reconciliation    Allergies:  No Known Allergies     Social History:  History     Social History   . Marital Status: Single     Spouse Name: Lyda Jester     Number of Children: N/A   . Years of Education: N/A     Occupational History   . Not on file.     Social History Main Topics   . Smoking status: Never Smoker    . Smokeless tobacco: Never Used   . Alcohol Use: No      occasionally on weekends   . Drug Use: No   . Sexually Active: Yes -- Female partner(s)       Family History:  Family History   Problem Relation Age of Onset   . Brain cancer Other    . Lung cancer Other     . Other Father      family hx of twins both sides       Review of Systems:  All other ROS negative unless otherwise noted in HPI.     Prenatal Ultrasounds:  No anatomic defects. Fetal wight and amniotic fluid normal for dates.    Prenatal Labs:  See Results Console  One hour GTT: normal   Glucose,50gm 1HR   Date/Time Value Range Status   09/11/2010  2:37 PM 103  63-135 (mg/dL) Final                           Physical Exam:  Filed Vitals:    12/05/10  1942   BP: 126/80   Pulse: 94   Temp: 36.8 C (98.2 F)   Resp: 20   Height: 1.6 m (5' 2.99")   Weight: 67.586 kg (149 lb)       HEENT: Normocephalic; atraumatic  Cardiovascular: Regular rate and rhythm with no murmurs  Respiratory: Clear to auscultate  Abdomen: Gravid non-tender  Extremities/Skin: No edema noted    Pelvic Exam:   Dilation: 3cm   Effacement: 90%   Station: -1     Estimated Fetal Weight: 3500 grams by leopolds    Presentation:  vertex by ultrasound    Fetal Monitoring:  Baseline: 130 bpm          Variability: Moderate (6-25 BPM)  Pattern: Accelerations  Category: Category I  Toco: Regular contractions q3-70min       Assessment/Plan:  20 y.o. G1P0 at [redacted]w[redacted]d with early labor, pt having regular painful contractions q3 minutes, with cervical change documented changed from finger tip to 3 cm dilated.  - Admit to L&D floor, she is a COB pt  - T/S, RPR and CBC sent  - Insert Hep lock  - Fetal Heart tones reassuring (Catagory I) - will continue to monitor   - Pt desires an epidural in active labor, we will give her IV Nubain now  - Pt is GBS neg, Rh pos and Rubella Non-immune, needs MMR post partum   - Labor- expectant management at this time, we will re-check in several hours to ensure she is still having cervical change and progressing into active labor.   - Anticipate NSVD    D/w  - Dr. Elenor Legato & Dr. Gwendlyn Deutscher     Pt discussed at interdisciplinary rounds, tracing reviewed --> agree with plan as outlined above.     Lyman Speller Gift, DO - PGY1    I agree  with the resident's/fellow's findings and plan of care as documented above.    Wonda Olds, MD

## 2010-12-05 NOTE — Progress Notes (Signed)
OB Intrapartum Note:    Subjective: Pt is increasingly more uncomfortable, contractions are becoming more regular and intense. She would like something for pain.    Vitals:   Filed Vitals:    12/05/10 1942   BP: 126/80   Pulse: 94   Temp: 36.8 C (98.2 F)   Resp: 20   Height: 1.6 m (5' 2.99")   Weight: 67.586 kg (149 lb)       Pelvic Exam:    Dilation: 4-5cm   Effacement: 100%   Station: -1        Electronic Fetal Monitoring:  Baseline  140 bpm         Variability: Moderate (6-25 BPM)  Pattern: Accelerations and one early deceleration  Category: Category I  Toco: regular, every 3-4 minutes      A/P:  20 y.o. G1P0 at [redacted]w[redacted]d admitted for labor at term.    1) FHT: reassuring, Category I  2) Pain: becoming intolerable, given that she has dilated to 5cm we will initiate epidural at this time.  3) GBS: negative  4) Labor: Entering active labor. We will have epidural initiated and manage expectantly.     Lyman Speller Monchel Pollitt D.O. - PGY1

## 2010-12-05 NOTE — Procedures (Signed)
Fetal Non-Stress Test  Patient: Carrie Velazquez  Age: 20 y.o.  Date of Birth: March 01, 1991  URK:[id number]  MRN: [id number]     Education Done:   External fetal monitoring: yes   Fetal kick counts: no   NST: yes   VAS: no   Manual stimulation:no    Other     Non-Stress test: Fetus A  NST Start Time: 1458 NST End Time:  1518   Variability: 6-25 bpm- Moderate Baseline:  155   Decelerations: yes Uterine Irritability:  no   Accelerations: yes Contractions: Irregular   Acoustic Stimulator: no Minutes Between Contractions:  irregular     Interpretation:  Nonstress Test Interpretation:  Reactive   Provider Reccomendations: Suggest repeat NST in 5-7 days (weekly) or as indicated by clinical condition.   Interpreting Provider: pressman   Comments:  variable times 2 afi today on ultrasound 10.cm   Next Test Date:     Blood pressure 124/62, last menstrual period 02/26/2010.  Additional Fetus:No    Test performed By: Vanessa Durham, RN  12/05/2010 3:01 PM

## 2010-12-05 NOTE — Progress Notes (Signed)
PT DISCHARGED HOME UNDELIVERED NO EVIDENCE OF ROM- PT LEFT LABOR AND DELIVERY PRIOR TO DISCHARGE INSTRUCTIONS COMPLETED

## 2010-12-06 ENCOUNTER — Encounter: Payer: Self-pay | Admitting: Obstetrics and Gynecology

## 2010-12-06 LAB — SYPHILIS SCREEN
Syphilis Screen: NEGATIVE
Syphilis Status: NONREACTIVE

## 2010-12-06 MED ORDER — MISOPROSTOL 200 MCG PO TABS *I*
800.0000 ug | ORAL_TABLET | Freq: Four times a day (QID) | ORAL | Status: DC
Start: 2010-12-06 — End: 2010-12-06

## 2010-12-06 MED ORDER — OXYTOCIN 30 UNITS IN 500ML NS WRAPPED *I*
1.0000 m[IU]/min | INTRAMUSCULAR | Status: DC
Start: 2010-12-06 — End: 2010-12-06
  Administered 2010-12-06: 1 m[IU]/min via INTRAVENOUS
  Administered 2010-12-06: 4 m[IU]/min via INTRAVENOUS
  Administered 2010-12-06: 3 m[IU]/min via INTRAVENOUS
  Administered 2010-12-06: 150 m[IU]/min via INTRAVENOUS
  Administered 2010-12-06 (×2): 2 m[IU]/min via INTRAVENOUS
  Administered 2010-12-06: 3 m[IU]/min via INTRAVENOUS
  Filled 2010-12-06 (×2): qty 500

## 2010-12-06 MED ORDER — MEASLES, MUMPS & RUBELLA VAC SC INJ *I*
0.5000 mL | INJECTION | SUBCUTANEOUS | Status: AC
Start: 2010-12-06 — End: 2010-12-07
  Administered 2010-12-07: 0.5 mL via SUBCUTANEOUS

## 2010-12-06 MED ORDER — TETANUS-DIPHTH-ACELL PERT 5-2-15.5 LF-MCG/0.5 IM SUSP *I*
0.5000 mL | INTRAMUSCULAR | Status: DC
Start: 2010-12-06 — End: 2010-12-08

## 2010-12-06 MED ORDER — IBUPROFEN 200 MG TAB - SELF MED *I*
600.0000 mg | ORAL_TABLET | Freq: Four times a day (QID) | ORAL | Status: DC | PRN
Start: 2010-12-06 — End: 2010-12-08

## 2010-12-06 MED ORDER — OXYCODONE-ACETAMINOPHEN 5-325 MG PO TABS *I*
1.0000 | ORAL_TABLET | ORAL | Status: DC | PRN
Start: 2010-12-06 — End: 2010-12-08

## 2010-12-06 MED ORDER — FENTANYL 2 MCG/ML AND 0.125% BUPIVACAINE *A*
10.0000 mL/h | INTRAMUSCULAR | Status: DC
Start: 2010-12-06 — End: 2010-12-06
  Filled 2010-12-06: qty 250

## 2010-12-06 MED ORDER — SIMETHICONE 80 MG PO CHEW - SELF MED *I*
80.0000 mg | CHEWABLE_TABLET | Freq: Four times a day (QID) | ORAL | Status: DC | PRN
Start: 2010-12-06 — End: 2010-12-08

## 2010-12-06 MED ORDER — OXYTOCIN 10 UNIT/ML IJ SOLN *I*
167.0000 mL/h | INTRAVENOUS | Status: DC
Start: 2010-12-06 — End: 2010-12-08

## 2010-12-06 MED ORDER — ONDANSETRON HCL 2 MG/ML IV SOLN *I*
4.0000 mg | Freq: Once | INTRAMUSCULAR | Status: AC
Start: 2010-12-06 — End: 2010-12-06
  Administered 2010-12-06: 4 mg via INTRAVENOUS
  Filled 2010-12-06: qty 2

## 2010-12-06 MED ORDER — FENTANYL 2 MCG/ML AND 0.125% BUPIVACAINE *A*
10.0000 mL/h | INTRAMUSCULAR | Status: DC
Start: 2010-12-06 — End: 2010-12-06

## 2010-12-06 MED ORDER — DOCUSATE SODIUM 100 MG SELF MED *A*
100.0000 mg | ORAL_CAPSULE | Freq: Two times a day (BID) | ORAL | Status: DC | PRN
Start: 2010-12-06 — End: 2010-12-08

## 2010-12-06 MED ORDER — CEFAZOLIN 2000 MG IN 100 ML D5W *I*
2000.0000 mg | Freq: Once | INTRAVENOUS | Status: AC
Start: 2010-12-06 — End: 2010-12-06
  Administered 2010-12-06: 2000 mg via INTRAVENOUS
  Filled 2010-12-06: qty 1

## 2010-12-06 MED ORDER — MISOPROSTOL 200 MCG PO TABS *I*
ORAL_TABLET | ORAL | Status: AC
Start: 2010-12-06 — End: 2010-12-06
  Administered 2010-12-06: 800 ug via RECTAL
  Filled 2010-12-06: qty 4

## 2010-12-06 NOTE — Progress Notes (Addendum)
OB Intrapartum Note:    Subjective: Patient comfortable without complaints.  Nausea resolved.    Vitals:   Filed Vitals:    12/06/10 0606 12/06/10 0622 12/06/10 0636 12/06/10 0652   BP: 111/67 104/61 116/69 118/71   Pulse: 78 90 74 66   Temp:       TempSrc:       Resp:       Height:       Weight:       SpO2:           Pelvic Exam:    Dilation: 6cm   Effacement: 90%   Station: -2 to -1    Electronic Fetal Monitoring:  Baseline  140 bpm         Variability: Moderate (6-25 BPM)  Pattern: Accelerations  Category: Category I  Toco: q 3-4 minutes    A/P:  20 y.o. G1P0 at [redacted]w[redacted]d admitted in labor.    1) FHT: Category I strip  2) Pain: Epidural in place.  3) GBS: negative  4) Labor: Protracted labor.  No cervical change over past 3 hours.  Will start augmentation with Pitocin.  CEFM.  Anticipate SVD.    D/w Dr. Ferne Coe.    Ashley Royalty, MD  OB-GYN Resident  # [phone]  12/06/2010  7:27 AM    ---------------------------------------------------------------------------------------------------  I saw and evaluated the patient. I agree with the resident's/fellow's findings and plan of care as documented above. Details of my evaluation are as follows: 20yo G1 @ 40 3/[redacted]wks EGA admitted in early labor.  S/p SROM for meconium.  Cervix with limited change since last check.  Will initiate oxytocin.  Reviewed plan with patient and her partner.  She is amenable to plan as discussed.  Category I tracing.  Contractions q59min.  Leopolds c/w ~7 pounds. Anticipate SVD.  Louie Bun, MD

## 2010-12-06 NOTE — Progress Notes (Signed)
Shortly after delivery, patient had 250cc of clot evacuated with fundal check.  EBL during delivery was 300cc.  Approximately one hour after delivery, patient again had another 100cc of blood/clot evacuated on fundal check.  Bimanual exploration performed with small piece of retained placenta evacuated from upper left uterine horn.  Placenta again examined from delivery and noted to be intact with no ragged edges.  Bleeding ceased after bimanual exploration.  Total EBL = 700cc.  Will check Hct in AM.  AVSS.  Will give single dose of Ancef.    Ashley Royalty, MD  OB-GYN Resident  # [phone]  12/06/2010  1:58 PM

## 2010-12-06 NOTE — Progress Notes (Signed)
S: Patient comfortable with epidural.     O: BP 118/66  Pulse 71  Temp(Src) 36.8 C (98.2 F) (Oral)  Resp 20  Ht 1.6 m (5' 2.99")  Wt 67.586 kg (149 lb)  BMI 26.40 kg/m2  SpO2 99%  LMP 02/26/2010    FHT:   Baseline: 140  Variability: moderate  Accels: present  Decels: none  Category I tracing  Toco: q 4-6 min    CE: Deferred    A/P: 20 y.o. yo G1P0 at [redacted]w[redacted]d WGA admitted for labor.    1. Labor: Continue expectant management.    2. FHT: Category I tracing  3. Pain: Comfortable with epidural   4. SROM with meconium around 1:30am.    Elenor Legato, MD  Ob/GYN Resident   Pager [phone]

## 2010-12-06 NOTE — Anesthesia Pre-procedure Eval (Signed)
Anesthesia Pre-operative Evaluation for [facility] Carrie Velazquez  Health History  Past Medical History   Diagnosis Date   . Unplanned pregnancy 11/11     accepted   . Nausea/vomiting in pregnancy 11/11     +ketones, to try on Unisom   . Rubella non-immune status 04/22/10     PP vaccine needed   . 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 Constipation in pregnancy 05/20/10     Colace 100mg  1 po BID   . Genital warts complicating pregnancy 08/07/10     Pt aware of dx, but bx not recommended until PP, condom use encouraged to decrease risk to partner   . Psoriasis 04/22/10     per pt hx   . Anemia HCT of 30 4/12     History reviewed. No pertinent past surgical history.  Social History  History   Substance Use Topics   . Smoking status: Never Smoker    . Smokeless tobacco: Never Used   . Alcohol Use: No      occasionally on weekends      History   Drug Use No     Allergies: No Known Allergies  Medications   Prescriptions prior to admission   Medication Sig   . prenatal multivitamin w/ folic acid (PRENAVITE) tablet Take 1 tablet by mouth daily   OTC chewable    . docusate sodium (COLACE) 100 MG capsule Take 100 mg by mouth 2 times daily   #100, 5 rfs    . ferrous sulfate 325 (65 FE) MG tablet Take 325 mg by mouth 2 times daily   #60, 3 rfs       Current Facility-Administered Medications   Medication Dose Route Frequency   . Fentanyl-Bupivacaine 28mcg/ml-0.125% Drip  10 mL/hr Epidural Continuous   . Lactated Ringers Infusion  150 mL/hr Intravenous Continuous     Medications Administered by Facility in Past 24hrs     Anesthesia Evaluation      Airway   Mallampati: II  TM distance: >3 FB  Neck ROM: full  Dental          Pulmonary - negative ROS and normal exam   Cardiovascular - negative ROS and normal exam    Neuro/Psych - negative ROS     GI/Hepatic/Renal - negative ROS     Endo/Other - negative ROS   Abdominal  - normal exam                      Additional ROS/Co-morbidities: None  known    Mental Status: alert, oriented to person, place, and time, normal mood, behavior, speech, dress, motor activity, and thought processes    Last PO Intake: 1830    Most Recent Vitals: BP 109/62  Pulse 85  Temp(Src) 36.8 C (98.2 F) (Oral)  Resp 20  Ht 1.6 m (5' 2.99")  Wt 67.586 kg (149 lb)  BMI 26.40 kg/m2  SpO2 97%  LMP 02/26/2010  Vital Sign Ranges (last 24hrs)  Temp:  [36.8 C (98.2 F)] 36.8 C (98.2 F)  Heart Rate:  [72-102] 85   Resp:  [16-20] 20   BP: (99-126)/(56-80) 109/62 mmHg   O2 Device: None (Room air) (12/05/10 2312)    Most Recent Lab Results   Blood Type  Lab Results   Component Value Date    ABORH A RH POS 12/05/2010    ABS Negative 12/05/2010  CBC  Lab Results   Component Value Date    WBC 9.8 12/05/2010    HCT 30* 12/05/2010    PLT 220 12/05/2010    Chem-7  No results found for this basename: NA, K, WBK, CL, CO2, UN, CREAT, WBGLU, PGLU     Electrolytes  No results found for this basename: CA, MG, PO4    Coags  No results found for this basename: PTI, INR, PTT    LFTs  No results found for this basename: AST, ALT, ALK         Pregnancy Test (if applicable)  No results found for this basename: PUPT, UPREG, SPREG, HCG1       EKG Results    none    Medical Problems  Patient Active Problem List   Diagnoses Date Noted   . Unplanned pregnancy      Priority: High     Class: Acute   . Psoriasis 10/16/2010     Priority: Low     Class: Chronic   . Normal pregnancy 12/05/2010   . Vaginal discharge 12/04/2010   . Nausea/vomiting in pregnancy    . Rubella non-immune status      Needs MMR postpartum     . Cystic fibrosis carrier      FOB negative     . Constipation in pregnancy    . Genital warts complicating pregnancy      PreOp/PreAn Diagnosis: *active labor    Planned Procedure: labor epidural    Anesthesia Plan    ASA 2     epidural   (Epidural/spinal or combined spinal/epidural analgesia, with back up plan to include general anesthesia, discussed with patient/family.  Accepts plan and  risks.)  Anesthetic plan and risks discussed with patient.    Plan discussed with attending.        Anesthesia Risks discussed: Headache, backache, or having a seizure, permanent weakness, numbness, or pain from a nerve injury     Invasive Monitoring discussed:  none    PEC/PreOp Attestation: Anesthesia options were discussed with the patient or proxy and they understand the risks and benefits of the various anesthetic options.    Author: Dicie Beam, CRNA Note created: 12/06/2010  at: 12:38 AM

## 2010-12-06 NOTE — Progress Notes (Signed)
OB Intrapartum Note:    Subjective: Pt continuing to do well with epidural. Trying to rest.     Vitals:   Filed Vitals:    12/06/10 0436   BP: 113/59   Pulse: 78   Weight:        Pelvic Exam:   Dilation: 7cm   Effacement: 100%   Station: -1  @5am     Electronic Fetal Monitoring:  Baseline  140 bpm         Variability: Moderate (6-25 BPM)  Pattern: Accelerations  Category: Category I  Toco: regular, every 5 minutes    A/P:  20 y.o. G1P0 at [redacted]w[redacted]d admitted in labor    1) FHT: reactive, reassuring   2) Pain: well controlled with epidural   3) GBS: negative  4) Labor: Pt continues to progress in dilation, we will not augment at this time and manage expectantly.     D/w - Dr. Luther Bradley. Shantika Bermea, DO - PGY1

## 2010-12-06 NOTE — Anesthesia Pre-procedure Eval (Signed)
Anesthesia Pre-operative Evaluation for [facility]  Health History  Past Medical History   Diagnosis Date   . Unplanned pregnancy 11/11     accepted   . Nausea/vomiting in pregnancy 11/11     +ketones, to try on Unisom   . Rubella non-immune status 04/22/10     PP vaccine needed   . 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 Constipation in pregnancy 05/20/10     Colace 100mg  1 po BID   . Genital warts complicating pregnancy 08/07/10     Pt aware of dx, but bx not recommended until PP, condom use encouraged to decrease risk to partner   . Psoriasis 04/22/10     per pt hx   . Anemia HCT of 30 4/12     History reviewed. No pertinent past surgical history.  Social History  History   Substance Use Topics   . Smoking status: Never Smoker    . Smokeless tobacco: Never Used   . Alcohol Use: No      occasionally on weekends      History   Drug Use No     Allergies: No Known Allergies  Medications   Prescriptions prior to admission   Medication Sig   . prenatal multivitamin w/ folic acid (PRENAVITE) tablet Take 1 tablet by mouth daily   OTC chewable    . docusate sodium (COLACE) 100 MG capsule Take 100 mg by mouth 2 times daily   #100, 5 rfs    . ferrous sulfate 325 (65 FE) MG tablet Take 325 mg by mouth 2 times daily   #60, 3 rfs       Current Facility-Administered Medications   Medication Dose Route Frequency   . Fentanyl-Bupivacaine 27mcg/ml-0.125% Drip  10 mL/hr Epidural Continuous   . Lactated Ringers Infusion  150 mL/hr Intravenous Continuous     Medications Administered by Facility in Past 24hrs     Anesthesia Evaluation      Airway   Mallampati: II  TM distance: >3 FB  Neck ROM: full  Dental          Pulmonary - negative ROS and normal exam   Cardiovascular - negative ROS and normal exam    Neuro/Psych - negative ROS     GI/Hepatic/Renal - negative ROS     Endo/Other - negative ROS   Abdominal  - normal exam                        Additional ROS/Co-morbidities: None  known    Mental Status: alert, oriented to person, place, and time, normal mood, behavior, speech, dress, motor activity, and thought processes    Last PO Intake: 1830    Most Recent Vitals: BP 120/67  Pulse 84  Temp(Src) 36.8 C (98.2 F) (Oral)  Resp 20  Ht 1.6 m (5' 2.99")  Wt 67.586 kg (149 lb)  BMI 26.40 kg/m2  SpO2 99%  LMP 02/26/2010  Vital Sign Ranges (last 24hrs)  Temp:  [36.8 C (98.2 F)] 36.8 C (98.2 F)  Heart Rate:  [60-125] 84   Resp:  [16-20] 20   BP: (84-129)/(42-103) 120/67 mmHg   O2 Device: None (Room air) (12/05/10 2312)    Most Recent Lab Results   Blood Type  Lab Results   Component Value Date    ABORH A RH POS 12/05/2010    ABS Negative 12/05/2010  CBC  Lab Results   Component Value Date    WBC 9.8 12/05/2010    HCT 30* 12/05/2010    PLT 220 12/05/2010    Chem-7  No results found for this basename: NA,  K,  WBK,  CL,  CO2,  UN,  CREAT,  WBGLU,  PGLU     Electrolytes  No results found for this basename: CA,  MG,  PO4    Coags  No results found for this basename: PTI,  INR,  PTT    LFTs  No results found for this basename: AST,  ALT,  ALK         Pregnancy Test (if applicable)  No results found for this basename: PUPT,  UPREG,  SPREG,  HCG1       EKG Results    none    Medical Problems  Patient Active Problem List   Diagnoses Date Noted   . Unplanned pregnancy      Priority: High     Class: Acute   . Psoriasis 10/16/2010     Priority: Low     Class: Chronic   . Normal pregnancy 12/05/2010   . Vaginal discharge 12/04/2010   . Nausea/vomiting in pregnancy    . Rubella non-immune status      Needs MMR postpartum     . Cystic fibrosis carrier      FOB negative     . Constipation in pregnancy    . Genital warts complicating pregnancy      PreOp/PreAn Diagnosis: *active labor    Planned Procedure: labor epidural    Anesthesia Plan    ASA 2     epidural   (Epidural/spinal or combined spinal/epidural analgesia, with back up plan to include general anesthesia, discussed with patient/family.   Accepts plan and risks.)  Anesthetic plan and risks discussed with patient.    Plan discussed with CRNA.        Anesthesia Risks discussed: Headache, backache, or having a seizure, permanent weakness, numbness, or pain from a nerve injury     Invasive Monitoring discussed:  none    Attending Attestation: The patient or proxy understand and accept the risks and benefits of the anesthesia plan. By accepting this note, I attest that I have personally performed the history and physical exam and prescribed the anesthetic plan within 48 hours prior to the anesthetic as documented by me above.    Author: Crecencio Mc, MD Note created: 12/06/2010  at: 1:33 AM

## 2010-12-06 NOTE — Progress Notes (Signed)
HH perinatal ultrasound    Done for postdates    EFW/AFV normal.    Patient currently admitted in labor.    Marla Roe, MD

## 2010-12-06 NOTE — Discharge Summary (Signed)
Discharge Summary       Admit date: 12/05/2010         Discharge date and time: 12/08/2010 11:49 AM  Admitting Physician: Dalene Seltzer, MD   Discharge Physician: Corinne Ports, MD     History of Present Illness:   Carrie Velazquez is a 20 y.o. G1P1001 at [redacted]w[redacted]d weeks and estimated date of delivery of 12/03/2010, by Last Menstrual Period who presented with Laboring   She has Unplanned pregnancy; Nausea/vomiting in pregnancy; Rubella non-immune status; Cystic fibrosis carrier; Constipation in pregnancy; Genital warts complicating pregnancy; Psoriasis; and Vaginal discharge on her problem list.    OB History     Grav Para Term Preterm Abortions TAB SAB Ect Mult Living    1 1 1       1        # Outc Date GA Lbr Len/2nd Wgt Sex Del Anes PTL Lv    1 TRM 6/12 [redacted]w[redacted]d 06:39 / 01:05 7lb13.6oz(3.561kg) F SVD EPI No Yes           [name]  has a past medical history of Unplanned pregnancy (11/11); Nausea/vomiting in pregnancy (11/11); Rubella non-immune status (04/22/10); Cystic fibrosis carrier (04/22/10); Constipation in pregnancy (05/20/10); Genital warts complicating pregnancy (08/07/10); Psoriasis (04/22/10); and Anemia (HCT of 30 4/12). She  has no past surgical history on file. [name]  has no known allergies. Her family history includes Brain cancer in an other family member; Lung cancer in an other family member; and Other in her father. She  reports that she has never smoked. She has never used smokeless tobacco. She reports that she does not drink alcohol or use illicit drugs..    Admission Diagnoses: No admission diagnoses for hospital encounter.  Discharge Diagnoses:   Information for the patient's newborn:  [name] [[id number]]   @tob @           Admission Condition: good  Discharged Condition: good    Hospital Course:   Uncomplicated Labor and Delivery    Hospital Problem List:  marc [facility] problst-hdr   Diagnoses Date Noted       RESOLVED   Diagnoses Date Noted Date Resolved   . Normal pregnancy [V22.2] 12/05/2010  12/07/2010       Author: Corinne Ports, MD  Note created: 12/10/2010  at: 2:49 PM              Signed: Corinne Ports, MD  On: 12/10/2010  at: 2:49 PM

## 2010-12-06 NOTE — Consults (Signed)
Initial Lactation visit:  MOB, Whitnie with very flat affect.  States baby fed well right after delivery, fell asleep at next feed.  Baby fussing in mom's arms.  LC suggested we try at breast.  Lowella Bandy originally said, "No, I just fed her."  LC discussed importance of frequent feeds and using cues to determine when baby should feed.  Journiee acquiesced and LC undressed baby for s2s on left side with bed pillow for support of baby.  Baby with good root latch rhythmic suck.  Plan is to feed baby at least q 3 hrs for at least 10 minutes.  Discussed s2s, frequency and duration of feeds, signs of MER.  Lactation to follow-up tomorrow.  Alene Mires RN, IBCLC

## 2010-12-06 NOTE — Progress Notes (Signed)
OB Intrapartum Note:    Subjective: Patient comfortable without complaints.  Feeling some pressure with CTX, but no increased pressure.     Vitals:   Filed Vitals:    12/06/10 0830 12/06/10 0837 12/06/10 0922 12/06/10 0930   BP:  118/79 112/66    Pulse:  79 83    Temp:    36.4 C (97.5 F)   TempSrc:    Oral   Resp: 16   16   Height:       Weight:       SpO2:           Pelvic Exam: @ 08:00   Dilation: 6cm   Effacement: 90%   Station: -2 to -1    Electronic Fetal Monitoring:  Baseline  135 bpm         Variability: Moderate (6-25 BPM)  Pattern: Accelerations  Category: Category I  Toco: q 3-4 minutes    A/P:  20 y.o. G1P0 at [redacted]w[redacted]d admitted in labor.    1) FHT: Category I strip  2) Pain: Epidural in place.  3) GBS: negative  4) Labor: Protracted labor.  Augmentation with Pitocin, currently @ 2 munits.  Will reassess when CTX frequency q 2 minutes or patient feeling increased pressure/discomfort.  CEFM.  Anticipate SVD.      Ashley Royalty, MD  OB-GYN Resident  # [phone]  12/06/2010  10:07 AM

## 2010-12-06 NOTE — Progress Notes (Signed)
OB Intrapartum Note:    Subjective: Patient complaining of increased rectal pressure.    Vitals:   Filed Vitals:    12/06/10 0952 12/06/10 1022 12/06/10 1030 12/06/10 1052   BP: 118/73 118/67  108/50   Pulse: 76 71  87   Temp:       TempSrc:       Resp:   16    Height:       Weight:       SpO2:           Pelvic Exam:    Dilation:10 cm   Effacement: 100%   Station: +2, OA to ROA without molding.    Electronic Fetal Monitoring:  Baseline  135 bpm         Variability: Moderate (6-25 BPM)  Pattern: Accelerations  Category: Category I  Toco: q 3-4 minutes    A/P:  20 y.o. G1P0 at [redacted]w[redacted]d admitted in labor.    1) FHT: Category I strip  2) Pain: Epidural in place.  3) GBS: negative  4) Labor: Fully dilated and +2 station.  Patient to start pushing.  Augmentation with Pitocin, currently @ 3 munits.  CEFM.  Anticipate SVD.    Will inform Attending.      Ashley Royalty, MD  OB-GYN Resident  # [phone]  12/06/2010  11:29 AM

## 2010-12-06 NOTE — Progress Notes (Signed)
OB Intrapartum Note:    Subjective: Pt is resting comfortably. Notes an increase in the sensation of pressure but she is still comfortable.    Vitals:   Filed Vitals:    12/06/10 0251   BP: 107/62   Pulse: 82     Pelvic Exam:   Dilation: 4-5cm   Effacement: 100%   Station: -2  On last exam    Electronic Fetal Monitoring:  Baseline 140  bpm         Variability: Moderate (6-25 BPM)  Pattern: Accelerations  Category: Category I  Toco: regular, every 5 minutes    A/P:  20 y.o. G1P0 at [redacted]w[redacted]d admitted in labor.    1) FHT: reactive, reassuring   2) Pain: well controlled with epidural, she does note an increase in pressure   3) GBS: negative  4) Labor: Pt in active labor. We will continue to monitor and manage expectantly.  If pt does not feel further increase in pressure or a need to push in the next few hours we will re-check her cervix to see if augmentation needed.    D/w - Dr. Bonita Quin    Lyman Speller. Carols Clemence, DO -PGY1

## 2010-12-07 LAB — CBC
Hematocrit: 27 % — ABNORMAL LOW (ref 34–45)
Hemoglobin: 9 g/dL — ABNORMAL LOW (ref 11.2–15.7)
MCV: 85 fL (ref 79–95)
Platelets: 218 10*3/uL (ref 160–370)
RBC: 3.2 MIL/uL — ABNORMAL LOW (ref 3.9–5.2)
RDW: 14 % (ref 11.7–14.4)
WBC: 11 10*3/uL — ABNORMAL HIGH (ref 4.0–10.0)

## 2010-12-07 MED ORDER — IBUPROFEN 600 MG PO TABS *I*
600.0000 mg | ORAL_TABLET | Freq: Three times a day (TID) | ORAL | Status: DC | PRN
Start: 2010-12-07 — End: 2010-12-07

## 2010-12-07 MED ORDER — IBUPROFEN 600 MG PO TABS *I*
600.0000 mg | ORAL_TABLET | Freq: Three times a day (TID) | ORAL | Status: DC | PRN
Start: 2010-12-07 — End: 2015-06-18

## 2010-12-07 MED ORDER — NORETHINDRONE  0.35 MG PO TABS *I*
1.0000 | ORAL_TABLET | Freq: Every day | ORAL | Status: DC
Start: 2010-12-07 — End: 2011-03-06

## 2010-12-07 NOTE — Consults (Addendum)
Lactation progress note:  LC assisted with waking/positioning/latch, taking baby down s2s in cc hold on right side.  With nipple sandwich in place, baby able to latch and suckle successfully.  Mom verbalizing comfort.  Will need continued reinforcement as mom is very timid.  FOB is very supportive. Gave handouts on s2s, 2nd night, and early weeks. Lactation to revisit tomorrow.  Alene Mires RN, IBCLC

## 2010-12-07 NOTE — Progress Notes (Addendum)
D/C Rounding OB Postpartum Progress Note:     Postpartum Day: 1    S:  Patient without complaints.  Good po intake with regular diet.  Pain is well controlled, with Motrin/Tylenol. Ambulating and voiding without difficulty.  +flatus.  Minimal to moderate lochia, equal to a period.  Denies clots, dizziness, lightheadedness.    Discussed birth control options in detail, and pt desires MiniPill at this time.  Also voiced that she would use condoms as well.    Vitals:   Filed Vitals:    12/07/10 1600   BP: 110/65   Pulse: 70   Temp: 36.7 C (98.1 F)   Resp: 18   Height:    Weight:        Vitals Range:   BP: (99-118)/(56-65)   Temp:  [36.7 C (98.1 F)-37.4 C (99.3 F)]   Temp src:  [-]   Heart Rate:  [70-113]   Resp:  [16-20]   SpO2:  [97 %-98 %]      Physical Exam:    Cardiovascular: Regular rate and rhythm with no murmurs  Respiratory: Clear to auscultate  Abdomen: Soft, appropriately tender, nondistended, +BS  Neurological: Decreased, low normal (1+)  Extremities/Skin: No edema noted  Fundus: Fundus firm at umbilicus -2    Labs:    Lab 12/07/10 1056 12/05/10 2125   HCT 27* 30*       A/P:  20 y.o. G1P1001 on PPD# 1 s/p SVD.  Doing well.  1) Continue routine postpartum care  2) Increase ambulation  3) Infant gender: female  4) Feeding type: bottle  5) PPBC: oral progesterone-only contraceptive  6) Rh: positive/Rubella:equivocal  7) Postpartum HCT/CBC: 27 (Ante Hct 30, no sx)  8) Dispo: Likely d/c home tmrw, pt comfortable with this.    Carrie Velazquez  OB/Gyn [place]  [phone]    I saw and evaluated the patient. I agree with the resident's/fellow's findings and plan of care as documented above.    Carrie Mares, MD

## 2010-12-07 NOTE — Progress Notes (Addendum)
OB Postpartum Progress Note:     Postpartum Day: 1    S:  Patient states she hurts all over. It hurts to move, in belly is sore, and she can't get comfortable to sleep. Voiding w/o difficulty and had a BM without difficulty.  Minimal to moderate lochia. Pt told staff to go ahead and bottle feed infant as breast feeding was preventing her from getting rest.      Vitals:   Filed Vitals:    12/06/10 2300   BP: 105/57   Pulse: 113   Temp: 37 C (98.6 F)   Resp: 16   Height:    Weight:        Vitals Range:   BP: (101-127)/(50-79)   Temp:  [36.3 C (97.3 F)-37.6 C (99.7 F)]   Temp src:  [-]   Heart Rate:  [66-113]   Resp:  [16-18]   SpO2:  [97 %-100 %]      Physical Exam:  HEENT: Normocephalic; atraumatic  Abdomen: Soft, appropriately tender, nondistended, +BS  Extremities/Skin: No edema noted  Fundus: Fundus firm at umbilicus -1    Labs:    Lab 12/05/10 2125   HCT 30*       A/P:  20 y.o. G1P1001 on PPD# 1 s/p NSVD.  Doing well.  1) Continue routine postpartum care  2) Increase ambulation   3) Infant gender: female  4) Feeding type: breast and bottle  5) PPBC: not discussed  6) Rh: positive/Rubella:equivcl  7) Dispo: Likely d/c home PPD2  8) Time spent discussing normal changes after pregnancy. Encouraged pt to use PRN Motrin for pain.    Lyman Speller Gift, DO - PGY1    I saw and evaluated the patient. I agree with the resident's/fellow's findings and plan of care as documented above. Details of my evaluation are as follows: pt is appropriately sore after events of delivery.  No evidence of infection.  If all stable, ok for d/c home on PPD#2.    Elvina Mattes, MD

## 2010-12-07 NOTE — Discharge Instructions (Signed)
DISCHARGE INSTRUCTIONS  Vaginal Delivery    12/05/2010     You have received sedative medication and/or general anesthesia which may make you drowsy for as long as 24 hours:     A)  DO NOT drive or operate any machinery for 24 hours     B)  DO NOT drink alcoholic beverages for 24 hours     C)  DO NOT make major decisions, sign contracts, etc for 24 hours  Please continue to adhere to these precautions if you are taking narcotic medication.    Discomfort after delivery:  - It is normal to have mild to moderate abdominal cramping for a few days to a few weeks  - Use acetaminophen (Tylenol) or ibuprofen for pain unless you are allergic to these drugs    Surgery site care after delivery  - It is normal to have some vaginal bleeding for up to 2 weeks after delivery.    Activity after delivery  - You can resume normal activities the day following your procedure  - Shower: You may shower  - Driving: you may drive after 24 hours  - Exercise: Do not resume vigorous exercise for 1 week.  - Intercourse: Nothing in the vagina (no tampons, douching or intercourse) for 6 weeks    Diet after surgery  - You may feel nauseated from the surgery or anesthesia.  Advance diet as tolerated.    Medications  See medication reconciliation sheet    When to call your doctor:  - fever greater than 100 degrees F and/or chills  - Nausea and vomiting  - inability to urinate  - severe cramping or abdominal pain not relieved by acetaminophen (Tylenol) or ibuprofen  - foul smelling discharge  - heavy vaginal bleeding (soaking through 1 pad every 1-2 hours)  - With any other concerns or questions  - If you have an emergency and are unable to contact your doctor, you may call 911.

## 2010-12-07 NOTE — Anesthesia Post-procedure Eval (Signed)
Patient expresses satisfaction with level of analgesia.  No headache, motor or sensory deficits noted at this time.

## 2010-12-07 NOTE — Progress Notes (Signed)
Social Work  Contacts:  Kyung Rudd (FOB)  Summary:  Social Work met with couple this morning.  Couple is thrilled with the birth of Carrie Velazquez.  Carrie Velazquez is shy and quiet.  She is eager to breast feed baby but says it's difficult.  Social work explained that the Advertising copywriter would be in to see her.  The couple say they are prepared at home with all equipment and supplies.  Carrie Velazquez denies any depression or anxiety issues.  She denies any drug or ETOH issues.  The couple seem to be comfortable with each other, loving and appropriate with baby.  Plan:  Young couple identify no social work needs at this time.  Glenna Fellows, [place] [phone]

## 2010-12-10 ENCOUNTER — Encounter: Payer: Self-pay | Admitting: Obstetrics and Gynecology

## 2010-12-13 LAB — SURGICAL PATHOLOGY

## 2010-12-19 NOTE — Progress Notes (Signed)
Quick Note:    I reviewed the results.     Carrie Velazquez  Maternal Fetal Medicine  [phone]    ______

## 2011-02-03 ENCOUNTER — Encounter: Payer: Self-pay | Admitting: Obstetrics and Gynecology

## 2011-02-04 ENCOUNTER — Ambulatory Visit: Payer: Self-pay | Admitting: Obstetrics and Gynecology

## 2011-02-04 ENCOUNTER — Telehealth: Payer: Self-pay | Admitting: Obstetrics and Gynecology

## 2011-02-04 NOTE — Telephone Encounter (Signed)
Pt. No showed for appointment. Letter sent.

## 2011-02-06 ENCOUNTER — Telehealth: Payer: Self-pay

## 2011-02-06 NOTE — Telephone Encounter (Signed)
Pt delivered 12/06/10 is no longer breastfeeding, has used micronor for birth control.  Pt was requesting new ocps but has enough rfs until next appt.

## 2011-02-12 NOTE — Telephone Encounter (Signed)
Pt aware and confirmed she will be at appt.  Pt states she has enough birth control until scheduled appt.

## 2011-02-12 NOTE — Telephone Encounter (Signed)
LM on pt's phoen [phone].  Also spoke to [place] at [phone].  He will have pt to call back to confirm Postpartum appt scheduled 03/06/11 1:00pm with Tamala Ser, CNM.  Pt delivered 12/06/10

## 2011-03-06 ENCOUNTER — Ambulatory Visit: Payer: Self-pay | Admitting: Obstetrics and Gynecology

## 2011-03-06 ENCOUNTER — Encounter: Payer: Self-pay | Admitting: Obstetrics and Gynecology

## 2011-03-06 VITALS — BP 138/69 | Ht 63.0 in | Wt 124.0 lb

## 2011-03-06 DIAGNOSIS — Z3009 Encounter for other general counseling and advice on contraception: Secondary | ICD-10-CM

## 2011-03-06 DIAGNOSIS — E041 Nontoxic single thyroid nodule: Secondary | ICD-10-CM

## 2011-03-06 DIAGNOSIS — Z23 Encounter for immunization: Secondary | ICD-10-CM

## 2011-03-06 DIAGNOSIS — Z0000 Encounter for general adult medical examination without abnormal findings: Secondary | ICD-10-CM

## 2011-03-06 DIAGNOSIS — O480 Post-term pregnancy: Secondary | ICD-10-CM

## 2011-03-06 DIAGNOSIS — R5383 Other fatigue: Secondary | ICD-10-CM

## 2011-03-06 LAB — POCT URINALYSIS DIPSTICK
Blood,UA POCT: NEGATIVE
Glucose,UA POCT: NORMAL
Ketones,UA POCT: NEGATIVE
Lot #: 21155702
Nitrite,UA POCT: NEGATIVE
PH,UA POCT: 5 (ref 5–8)

## 2011-03-06 LAB — HEMATOCRIT: Hematocrit: 37 % (ref 34–45)

## 2011-03-06 MED ORDER — NORGESTIMATE-ETH ESTRADIOL 0.25-35 MG-MCG PO TABS *I*
1.0000 | ORAL_TABLET | Freq: Every day | ORAL | Status: AC
Start: 2011-03-06 — End: 2011-09-02

## 2011-03-06 NOTE — Progress Notes (Signed)
Carrie Velazquez presented today for Gardasil # 1 administration.    BP: 138/69    Wt. 124 lb (56.246 kg)        Allergies:   Allergies as of 03/06/2011   . (No Known Allergies)       LMP: 02/26/2010    UPT : Neg    Gardasil administered LOT [id number] Exp 10oct14, patient tolerated well.  Information sheet given to patient.  Pt. to return for Gardasil #2 in 2 months.  Pt. acknowledged understanding.  Latricia Heft

## 2011-03-06 NOTE — Patient Instructions (Signed)
Birth Control Pills           What are birth control pills? Birth control pills (BCPs), also called oral contraceptives or the pill, help you avoid getting pregnant. They help you and your partner plan how many children you want and when to have them. They are made of the female hormones, estrogen and progesterone, which control body functions. These hormones work by preventing ovulation. Ovulation is the time each month when the ovaries make and release an egg cell. Before pregnancy can occur, an egg needs to be fertilized by sperm. Birth control pills keep women from making an egg cell each month. They may also keep a fertilized egg from sticking to the lining of the uterus (womb) and growing into a baby. When BCPs are used correctly, the chances of women getting pregnant are very low.    What is the female reproductive system?   A woman's reproductive system includes the ovaries, fallopian tubes, uterus, cervix, and vagina. The ovaries are two egg-shaped organs that make egg cells, and are found on the ends of fallopian tubes. The fallopian tubes are two hollow tubes on each side of the upper part of the uterus. These are passages for the egg cells from the ovaries to the uterus. The uterus, or womb, is the pear-shaped organ in your abdomen (stomach) where your baby grows during pregnancy. The cervix is the opening where sperm enters at the bottom of the uterus. The vagina is a passageway that receives the female's penis.   The ovaries make and release an egg cell each month during ovulation. The egg cell goes from the ovary into a fallopian tube. About 10 to 14 days later, the woman will have her menstruation (monthly period). If sperm enters the uterus and reaches the fallopian tubes, the egg may be fertilized (sperm meets the egg cell). The fertilized egg then goes down the tube and sticks to the endometrium (lining of uterus). The baby grows inside the uterus during pregnancy.    What may be done before I start  using birth control pills?   Your caregiver will ask you about diseases and illnesses you have had in the past. He will check your risk for blood clots, heart conditions, or stroke (problem with blood vessels of your brain). He will also check your blood pressure, and may do a breast and pelvic exam. A Pap smear may also be done during the pelvic exam. This is a test to make sure you do not have abnormal changes on your cervix. You may need other tests, like a urine test, to make sure that you are not pregnant. You will need to see your caregiver regularly while using BCPs.    Your caregiver will ask about medicines that you use. Your caregiver may also ask you if you smoke. Smoking increases your chances of a having stroke, heart attack, or a blood clot in your lungs. If you smoke, you should not take certain kinds of BCPs.        What are the types of birth control pills? The different types of BCPs include monophasic, multiphasic, progesterone-only, low-dose, and extended cycle BCPs. They have different amounts of estrogen and progesterone, side effects, risks, and schedules for taking them.     Monophasic birth control pills: These pills have the same amount of estrogen and progesterone in each active pill. They may help prevent sudden changes in your mood or feelings caused by changing hormone levels.       Multiphasic birth control pills: The level of hormones in these pills change like those in your body through the month. Each pill has different amounts of estrogen and progesterone depending on the day they will be taken. This helps you get the right    amount of hormones and helps prevent any unwanted side effects.    Progesterone-only birth control pills: These BCPs only contain progesterone, which prevents ovulation and thickens cervical mucus to block sperm. Progesterone also prevents a fertilized egg from attaching to the endometrium and letting a baby grow in the uterus. They may give you less nausea,  breast pain, weight gain, and changes in mood than other BCPs.  Low-dose birth control pills: These BCPs have lower amounts of the estrogen and progesterone which may help avoid unwanted side effects. They are available in monophasic and multiphasic forms, and may help prevent blood clots and putting on weight.          Extended cycle birth control pills: These pills are taken every day for    three months at a time to stop ovulation. You may also have less frequent   periods, or you may miss periods completely. You may have some bleeding   during the first 3 to 4 months of use. Do not worry, this should go away after   some time. These pills may also be used to treat conditions of your uterus,   such as endometriosis.    When do I take my birth control pills?   If your pills are packaged in a 21-day pack, take one pill from the pack every day. After you finish the 21-day pack, do not take any BCPs for the next seven days. You will have your period during the seven days off the pill. Start a new pack on day eight.    If your pills are packaged in a 28-pack, take one pill from the pack every day. The last seven pills in the 28-day pack are usually a different color than the rest of the pills. You may start a new pack after finishing the old one.    If you are taking extended cycle birth control pills, take one pill each day for 12 weeks and take care not to miss a pill.    Pick a time of the day that is easy for you to take BCPs. Taking them at the same time every day may help prevent bleeding. If you want to change the time you take BCPs, finish a pack of pills and try a different time for the next pack.  What are the advantages of using birth control pills? Birth control pills may help decrease bleeding and pain during your monthly period. They may also help prevent cancer of the uterus and ovaries.    What are the disadvantages of using birth control pills?   You may have sudden changes in your mood or feelings  when taking BCPs. You may have nausea (upset stomach), decreased appetite for sex, and increased risk for blood clots. You may have an increased appetite and gain weight very fast. You may also have bleeding in between periods, less frequent periods, vaginal dryness, and breast pain.    When can I start taking my birth control pills? You may start taking your pills at any of the   following times:   The first day of your monthly period: You will be protected right away. You may not need to use another birth control method during the   first seven days of your period.    The first sunday after your monthly period begins: Start taking your BCPs the first sunday after your monthly period begins. You may take them even if you still have your period.    The fifth day of your monthly period: Use another birth control method for the first 7 days after taking the pill. Ask your caregiver about other birth control methods.    What should I do if I forget to take my pills?     If you miss one pill, take it as soon as you remember. Continue taking the remaining pills at your usual time.  If you miss two pills in a row, take one as soon as you remember. Continue taking the remaining pills at your usual time.  If you miss three BCPs in a row, call your caregiver. You may have to stop that pack of pills, wait for your period, and start a new pack  Do not take two BCPs in one day.  Use barrier methods of contraception for two weeks if you cannot remember how many pills you may have missed. You may get pregnant if you have sex and not taken two or more BCPs in a row. You may want to consider using another method of birth control if you find that you forget to take your BCPs often.    What should do I do if I want to get pregnant? If you are planning to have a baby, ask your caregiver when you may stop taking your BCPs. It may take some time for you to start ovulating again. Ask your caregiver for more information about getting  pregnant after taking BCPs.    When should I start taking birth control pills after having a baby? Your caregiver may let you start taking progesterone-only pills after giving birth. For those breast feeding, some BCPs may only be started from six weeks to six months after giving birth. For those not breast feeding, BCPs may be started three weeks after giving birth. Ask your caregiver for more information about taking BCPs after giving birth.      Reviewed 03/2010

## 2011-03-06 NOTE — Progress Notes (Signed)
Subjective:       Carrie Velazquez is a 20 y.o. female who presents for a postpartum visit. She is 10 weeks postpartum following a spontaneous vaginal  delivery at term gestational weeks.   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  Main complaint today is severe fatigue.  Needs iron check level.  Bottlefeeding  Denies pp depression   LMP approx 02/14/11.  Has been using condoms    Patient's medications, allergies, past medical, surgical, social and family histories were reviewed and updated as appropriate.    Review of Systems  Pertinent items are noted in HPI.     Objective:      BP 138/69  Ht 5\' 3"  (1.6 m)  Wt 124 lb (56.246 kg)  BMI 21.97 kg/m2  LMP 02/26/2010  Breastfeeding? No   General:  alert    Breasts:  inspection negative, no nipple discharge or bleeding, no masses or nodularity palpable   Lungs: clear to auscultation bilaterally   Heart:  regular rate and rhythm, S1, S2 normal, no murmur, click, rub or gallop   Abdomen: soft, non-tender; bowel sounds normal; no masses,  no organomegaly   Thyroid:  ? Enlarged with ? Nodule on R side of thyroid vs lymph node    External genitalia: WNL, repair healed, no condyloma visible  Vagina:  Deferred, no spec used  Cervix   Deferred no spec used, no CMT, L/T/C   Uterus:  WNL, small, midline,non tender  Adnexae:  WNL    upt negative     Carrie Velazquez is a G1P1001 presenting for her postpartum exam s/p spontaneous vaginal delivery Edinburgh Postnatal Depression scale shows3/30  Patient Active Problem List   Diagnoses Code   . Rubella non-immune status had vaccine in the hosp V49.89   . 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 Genital warts complicating pregnancy  Appear resolved 647.60   . Psoriasis 696.1   . Vaginal discharge 623.5   . SVD (spontaneous vaginal delivery) 650   . Fatigue 780.79   . Thyroid nodule 241.0   . Health care  maintenance V70.9   . Family planning  Ok for OCP's, no contraindications except rare cigar use V25.09   Need for HPV vaccine  Bottle feeding  FOB involved and supportive, believes monogomaous      .     Plan:     Orders Placed This Encounter   Procedures   . Chlamydia plasmid DNA amplification   . N. Gonorrhoeae DNA amplification   . HPV vaccine quadravalent 3 dose IM (FOR AMBULATORY USE ONLY)   . Hematocrit   . T4, free   . TSH   . POCT urinalysis dipstick     I have discontinued Ms. Dinovo's docusate sodium and norethindrone.  I am also having her start on norgestimate-ethinyl estradiol.  Additionally, I am having her maintain her ferrous sulfate and ibuprofen.     1. Pap was not due, due 12/13  2. STI screening: GC, chlamydia urine, thinks low risk,   3. Contraception: condoms: 100% of the time, desires to start ocp's, rev use, s/e, b/u method first cycle and with antibiotics, no cigar or cigarette use, rev warnings  4. Support: FOB is involved, family is supportive, Domestic violence:No.  5. Lab tests/imaging:TSH, ft4 and Hct  Gardasil #1 today  6. Referrals:pcp to evaluate thyroid nodule , stressed  importance of this f/u  7. Problem list reviewed and updated. Follow up in: 2 months., S/B/P slightly up at this visit, no hx of htn, re check with Gardasil in 2 months or with 6 month OCP check, no problem in the past with increased B/P and OCP's

## 2011-03-07 LAB — TSH: TSH: 1.15 u[IU]/mL (ref 0.27–4.20)

## 2011-03-07 LAB — T4, FREE: Free T4: 1.1 ng/dL (ref 0.9–1.7)

## 2011-03-07 LAB — CHLAMYDIA PLASMID DNA AMPLIFICATION: Chlamydia Plasmid DNA Amplification: 0

## 2011-03-07 LAB — N. GONORRHOEAE DNA AMPLIFICATION: N. gonorrhoeae DNA Amplification: 0

## 2015-06-10 HISTORY — PX: PR LIGATION,FALLOPIAN TUBE W/C-SECTION: 58611

## 2015-06-10 HISTORY — PX: PR LIG/TRNSXJ FALOPIAN TUBE CESAREAN DEL/ABDML SURG: 58611

## 2015-06-10 NOTE — L&D Delivery Note (Signed)
Delivery Summary Note  Patient: Carrie Velazquez  Age: 25 y.o.  Date of Birth: November 13, 1990  ZOX:[id number]  MRN: [id number]    Admission Summary  Date and time of admission: 02/04/2016  1:57 PM Attending Provider: Judith Blonder, MD Provider Group : [facility]  Active Hospital Problems    Diagnosis    *!*SVD (spontaneous vaginal delivery)     Allergies:   Review of patient's allergies indicates no known allergies (drug, envir, food or latex).  Weight:  PrePregnancy Weight: 55.3 kg (122 lb) Weight: 74.8 kg (165 lb) Pregnancy weight change (kg): 19.5 kg   Obstetric History    G2   P2   T2   P0   A0   L2     SAB0   TAB0   Ectopic0   Multiple0   Live Births2       Breast or Formula Feeding: Breast feeding      Prenatal Labs  ABO RH Blood Type   Date Value Ref Range Status   02/04/2016 A RH POS  Final     Antibody Screen   Date Value Ref Range Status   02/04/2016 Negative  Final     Rubella IgG AB   Date Value Ref Range Status   07/06/2015 POSITIVE  Final     Comment:     TEST METHOD: Multiplex flow immunoassay     RPR Screen   Date Value Ref Range Status   04/22/2010 NONREACT NONREACT Final     Comment:     TEST METHOD: Charcoal Particle Agglutination     HBV S Ag   Date Value Ref Range Status   07/06/2015 NEG  Final     Comment:     Test Method: CMIA     Group B Strep Culture   Date Value Ref Range Status   01/14/2016 .  Final     HIV 1&2 ANTIGEN/ANTIBODY   Date Value Ref Range Status   07/06/2015 Nonreactive  Final     Comment:     Test Method: CMIA     HIV 1&2 Ab screen   Date Value Ref Range Status   04/22/2010 NEG  Final     Comment:     TEST METHOD: EIA      Dating Information  Patient's last menstrual period was 05/04/2015 (exact date). EDD: 02/08/2016, by Last Menstrual Period  Information for the patient's newborn:  Prajna, [place] [[id number]]     Delivery Information  Boy Hamre  Sex: female Gestational Age: [redacted]w[redacted]d MRN: [id number] PCP: Provider, None   Delivery Date/Time: 02/05/2016 11:27 AM   Time of Head Delivery: 02/05/2016 11:26 AM  Delivery  Type: Vaginal, Spontaneous Delivery        Meconium at time of delivery: none  Delivery Location: labor flr      Labor Onset Date/Time:     Dilation Complete Date/Time: 8/29/201710:48 AM     Preterm labor: No Antenatal steroids: None Antibiotics received during labor: No      First Cervical ripening date/time:   /   Cervical ripening Type: Misoprostol        Rupture Date: 02/05/2016 Rupture Time: 10:53 AM  Details:   Rupture Type: Artificial Color: Clear Amount: Large  Induction:     Indications:   Augmentation: .None  Labor complications: None     Delivering clinician:  Candyce Churn   Other personnel:   Provider Role   [facility], KARA Current Attending   Hope Budds Delivery Nurse   Katrinka Blazing,  LAURA S Registered Nurse            Anesthesia Method: Epidural-   Analgesics:        Presentation: Vertex Position: Middle Occiput Anterior  Prophylactic Maneuver: No     Shoulder Dystocia: No                                                         Resuscitation: Dry;Tactile Stimulation  Living Status: Living           APGARs Total Color Reflex irritability Breath Heart Rate Muscle Tone Assigned By   (greater than 7 no need for next measurement)   1 min 8  0  2  2  2  2   K. Nardone RN   5 min 9  1  2  2  2  2   K. Nardone RN   10 min                 15 min                 20 min                 25 min                 30 min                   Birth Weight: 3884 g (8 lb 9 oz) Height: 21" Head Circumference: 14.2 cm Observed Anomalies:      Cord: 3 Vessels     Complications: Cord around         Cord around: neck     Cord tension: loose     Number of loops: 2       Interventions: reduced       Clamping Delayed: 1  Clamped Date/Time:8/29 11:28 AM  Cord blood disposition: Lab;Refrigerator     Gases sent: No       Stem cell collection -by MD-: No  Maternal Info:   Placenta Delivery Date/Time: 8/29 11:30 AM     Removal: Spontaneous     Appearance: Intact     Disposition: discarded  Bonding:     Stages of Labor:          Stage One:    h   m          Stage Two:  0h  38m          Stage Three:  0h  33m  Episiotomy: None           Perineal lacerations:     Repaired                             Delivery est. blood loss (mL):         Needle Count: Correct        Sponge Count: Correct  Procedures: None           R2 OB Delivery Note  Carrie Velazquez is a 25 y.o. G2P1001, now [id number], who was admitted on 02/04/2016 at [redacted]w[redacted]d for an induction of labor indicated by polyhydramnios (AFI 27.2).  She received misoprostol, cook balloon, pitocin and an epidural.  At approximately 10:45 on 02/05/2016, she was noted to be +  2 station with a bulging bag.  She underwent AROM for clear fluid and she spontaneously delivered a viable female infant in the DOA position.  The head was delivered with maternal expulsive effort only, nuchal cord x2 was noted and reduced, the anterior shoulder was delivered with maternal expulsive effort followed immediately by the remainder of the fetal body with no additional maneuvers needed. The infant weighed 3884 grams, with APGAR of 8 and 9.  The infant's mouth and nares were bulb suctioned, the cord was clamped and cut, and the infant was passed to the maternal abdomen active and crying.  On inspection of the vulva and perineum, a small first degree laceration was repaired with a single figure of eight stitch. Mother and infant tolerated the delivery well.      Approximately 30 minutes following delivery, free flow was noted with fundal massage.  On exam, the free flow reported appeared to be largely urine.  The bladder was straight cathed for 100 cc and no further free flow was noted on fundal massage.  The fundus was noted to be firm at U-1.      EBL 400 cc.    Candyce ChurnJillian Caldwell Kronenberger, DO  Obstetrics/Gynecology PGY-2  Pager [phone]

## 2015-06-18 ENCOUNTER — Ambulatory Visit: Payer: Self-pay | Admitting: Obstetrics and Gynecology

## 2015-06-18 ENCOUNTER — Ambulatory Visit
Admission: RE | Admit: 2015-06-18 | Discharge: 2015-06-18 | Disposition: A | Discharge status: Home / Self Care | Payer: Self-pay | Source: Ambulatory Visit | Admitting: Obstetrics and Gynecology

## 2015-06-18 ENCOUNTER — Encounter: Payer: Self-pay | Admitting: Obstetrics and Gynecology

## 2015-06-18 VITALS — BP 100/60 | Ht 63.0 in | Wt 131.0 lb

## 2015-06-18 DIAGNOSIS — Z3201 Encounter for pregnancy test, result positive: Secondary | ICD-10-CM

## 2015-06-18 LAB — POCT URINE PREGNANCY
Lot #: 119119
Preg Test,UR POC: POSITIVE — AB

## 2015-06-18 MED ORDER — CLASSIC PRENATAL 28-0.8 MG PO TABS *I*
1.0000 | ORAL_TABLET | Freq: Every day | ORAL | 5 refills | Status: AC
Start: 2015-06-18 — End: 2015-12-15

## 2015-06-18 NOTE — Progress Notes (Signed)
[facility] Obstetric Risk Assessment and Options Counseling Visit    CC/HPI: SHERA is a 25 y.o. G1P1001 who presents to the office today reporting that she had a positive pregnancy test.    Her UPT results from today are:   Recent Results (from the past 24 hour(s))   POCT urine pregnancy    Collection Time: 06/18/15  8:56 AM   Result Value Ref Range    Preg Test,UR POC Positive (A) Negative-Dilute urine specimens may cause false negative urine pregnancy results...    INTERNAL CONTROL POCT URINE PREGNANCY *Yes-internal procedural control(s) acceptable     Exp date 03/11/16     Lot # [id number]            Pregnancy details:  Her LMP is Patient's last menstrual period was 05/04/2015 (exact date)..    This pregnancy is unplanned, is desired.    She does not report ectopic risk factors (infections, prior tubal surgery, prior ectopic pregnancy).    Other risk factors for pregnancy include: no medications, no habits, no family history, no personal history.    She denies bleeding.    She is not taking prenatal vitamins.    She has noticed some positive home pregnancy test.         Early pregnancy risks:  None      GYNHX:  LMP: Patient's last menstrual period was 05/04/2015 (exact date).  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 No pertinent past surgical history.     MEDS:  Prior to Admission medications    Not on File          See medication reconciliation.    ALLERGIES:  No Known Allergies (drug, envir, food or latex)     Keys from social history (habits) include:    Social History     Social History    Marital status: Single     Spouse name: N/A    Number of children: N/A    Years of education: N/A     Occupational History    Not on file.     Social History Main Topics  Smoking status: Never Smoker    Smokeless tobacco: Never Used    Alcohol use Yes      Comment: occasionally on weekends    Drug use: No    Sexual activity: Yes     Partners: Male     Other Topics Concern    Not on file     Social History Narrative        Keys from family history include:    Family History   Problem Relation Age of Onset    Brain cancer Other     Lung cancer Other     Other Father      family hx of twins both sides        ROS:  Pertinent items noted above    Objective:      Visit Vitals    BP 100/60    Ht 1.6 m (5\' 3" )    Wt 59.4 kg (131 lb)    LMP 05/04/2015 (Exact Date)    BMI 23.21 kg/m2     General appearance: alert and cooperative    Assessment and Plan:        ASSESSMENT: Lowella BandyIKKI is a 25 y.o. female G1P1001 at 6 3/7 weeks by LMP.   1. Positive pregnancy test          PLAN:  1. Pregnancy is unplanned and is desired.    2. Review of her history does not indicate ectopic pregnancy risk factors.  3. Review of her LMP information demonstrates that she does not have a certain LMP.  She does have an indication for dating scan.  A dating scan was ordered today.  4. She does not note vaginal bleeding to date in the pregnancy.  Her blood type is A RH POS.  5. Rx given for prenatal vitamins.  6. Review of schedule of recommended  visits Yes, and she expressed understanding of her responsibility to follow through with the above recommendations for assessment and follow-up.    Return to office in 2-3 weeks and prn      Nalany Steedley A Harcleroad, NP  06/18/2015  8:57 AM

## 2015-06-18 NOTE — Patient Instructions (Signed)
Nutrition Therapy for Morning Sickness                   This nutrition therapy will help you with the nausea and vomiting experienced during pregnancy.   Recommended Foods:    • You may eat any foods except those on the Not Recommended list. Eat when you feel hungry. Foods without much smell may be easier for you to eat.     • The following foods may be easier to eat when you feel nauseous: o Cold foods such as ice cream, popsicles, or frozen fruit     • Warm foods such as mashed or baked potatoes, soups, or toast     • Spicy foods such as salsa, gingersnaps, gingerbread, or curries     • Tart/sour foods such as tomato or vegetable juice, dill pickles, lemons/lemonade, limes/limeade, or other citrus fruits and juices     • Creamy foods such as whole milk, custards, puddings, or yogurt     • Crunchy foods such as raw vegetables (particularly carrots and celery), chips, raw fruits (particularly apples or pears), nuts, crackers, or dry cereal     • Soft foods such as cake, cottage cheese, cooked carrots, or green beans     • Beverages and liquid foods such as fruit juice, ginger ale, other soft drinks, water, gelatin, or broth     • Salty foods such as chips, salted-top crackers, dip, pizza, or tomato or vegetable juice     Foods made with chocolate such as chocolate milk, pudding, or ice cream, or fudge sauce for dipping fruit or crackers.

## 2015-06-22 ENCOUNTER — Ambulatory Visit
Admission: RE | Admit: 2015-06-22 | Discharge: 2015-06-22 | Disposition: A | Discharge status: Home / Self Care | Payer: Self-pay | Source: Ambulatory Visit | Admitting: Obstetrics and Gynecology

## 2015-06-22 ENCOUNTER — Ambulatory Visit: Payer: Self-pay

## 2015-06-22 DIAGNOSIS — Z3201 Encounter for pregnancy test, result positive: Secondary | ICD-10-CM

## 2015-07-02 ENCOUNTER — Encounter: Payer: Self-pay | Admitting: Obstetrics and Gynecology

## 2015-07-06 ENCOUNTER — Other Ambulatory Visit: Payer: Self-pay | Admitting: Obstetrics and Gynecology

## 2015-07-06 ENCOUNTER — Ambulatory Visit: Payer: Self-pay | Admitting: Obstetrics and Gynecology

## 2015-07-06 ENCOUNTER — Encounter: Payer: Self-pay | Admitting: Obstetrics and Gynecology

## 2015-07-06 VITALS — BP 100/60 | Ht 62.99 in | Wt 133.0 lb

## 2015-07-06 DIAGNOSIS — Z141 Cystic fibrosis carrier: Secondary | ICD-10-CM

## 2015-07-06 DIAGNOSIS — Z3490 Encounter for supervision of normal pregnancy, unspecified, unspecified trimester: Secondary | ICD-10-CM | POA: Insufficient documentation

## 2015-07-06 DIAGNOSIS — Z3480 Encounter for supervision of other normal pregnancy, unspecified trimester: Secondary | ICD-10-CM

## 2015-07-06 DIAGNOSIS — Z3A09 9 weeks gestation of pregnancy: Secondary | ICD-10-CM

## 2015-07-06 LAB — DRUG SCREEN CHEMICAL DEPENDENCY, URINE
Amphetamine,UR: NEGATIVE
Benzodiazepinen,UR: NEGATIVE
Cocaine/Metab,UR: NEGATIVE
Opiates,UR: NEGATIVE
THC Metabolite,UR: NEGATIVE

## 2015-07-06 LAB — POCT URINALYSIS DIPSTICK
Blood,UA POCT: NEGATIVE
Glucose,UA POCT: NORMAL
Ketones,UA POCT: NEGATIVE
Leuk Esterase,UA POCT: NEGATIVE
Lot #: 12539702
Nitrite,UA POCT: NEGATIVE
PH,UA POCT: 8 (ref 5–8)
Protein,UA POCT: NEGATIVE mg/dL

## 2015-07-06 LAB — TYPE AND SCREEN FOR PNP
ABO RH Blood Type: A POS
Antibody Screen: NEGATIVE

## 2015-07-06 NOTE — Progress Notes (Signed)
NEW OB VISIT NOTE:      Subjective:      Carrie Velazquez is a 25 y.o.G2P1001 female being seen today for her initial obstetrical visit at [redacted]w[redacted]d. Patient reports no bleeding and no cramping.     PREGNANCY RISKS:  CF carrier    Patient's medications, allergies, past medical, surgical, social and family histories were reviewed and updated as appropriate.    Domestic violence:No      Objective:      Visit Vitals    BP 100/60    Ht 1.6 m (5' 2.99")    Wt 60.3 kg (133 lb)    LMP 05/04/2015 (Exact Date)    BMI 23.57 kg/m2     Growth and development appear appropriate for age. Carrie Velazquez is appropriately mature for stated age.    General appearance: alert and cooperative  Head: Normocephalic, without obvious abnormality, atraumatic  Neck: no adenopathy, supple, symmetrical, trachea midline and thyroid not enlarged, symmetric, no tenderness/mass/nodules  Lungs: clear to auscultation bilaterally  Heart: regular rate and rhythm, S1, S2 normal, no murmur, click, rub or gallop  Abdomen: soft, non-tender; bowel sounds normal; no masses,  no organomegaly  Pelvic:    External: No lesions or erythema   Vagina: Pink, no lesions, white discharge   Cervix: Pink, no lesions, no discharge, os closed   Uterus: Non-tender, consistent with gestational age   Adnexa: Non-tender, no masses      Assessment:     25 y.o.G2P1001 at [redacted]w[redacted]d who presents for New OB visit.    ICD-10-CM ICD-9-CM   1. Prenatal care, subsequent pregnancy, unspecified trimester Z34.80 V22.1   2. [redacted] weeks gestation of pregnancy Z3A.09 V22.2   3. Cystic fibrosis carrier Z14.1 V83.81         Plan:     Patient Active Problem List    Diagnosis Date Noted    Psoriasis 10/16/2010     Priority: Low     Class: Chronic    Prenatal care 07/06/2015     CLINIC SITE: [facility]  CF screen: done in 2011, CF carrier  Hgb electrophoresis: WNL 2011  First trimester screen: Declined  Prenatal Labs reviewed with pt and HIV result in chart:   MSAFP/AFP:  Anatomic:  Varicella status:             Thyroid  nodule 03/06/2011     ? Thyroid nodule on R side of thyroid, referred to PCP and labs done, 03/06/2011        Cystic fibrosis carrier      FOB tested in previous pregnancy & is negative       Pap, GC, CT, sent  Prenatal profile, HIV, urine culture, UCDS sent  Offered flu vaccine; patient declines  Return to office in 4 weeks    Carrie Czajka A Harcleroad, NP

## 2015-07-06 NOTE — Patient Instructions (Signed)
Common Discomforts:  FIRST TRIMESTER    Pregnancy is an exciting time in your life.  Even though you may have planned to have this baby, your body's changes may not be something you planned on.  Here are some of the more common discomforts that women have during pregnancy.  Talk to your health care provider (HCP) if these tips do not seem to help or if you have other concerns.    Nausea and Morning Sickness  Nausea is the most common complaint of early pregnancy.  It is usually caused by hormonal changes.  Things that help with nausea include the following:  " Do not let your stomach get completely empty.  Eat small, frequent meals.   " Keep crackers or dry toast at your bedside and eat before getting up.  " Eat a small snack before going to bed at night.  " Do not drink large amounts of liquids at one time.  " Avoid foods that are greasy, spicy or have odors that bother you.  " Take your prenatal vitamin at night if taking it in the morning upsets your stomach.    Fatigue  Feeling tired is common in early pregnancy due to the increase needs of your body.  Your amount of sleep needs to increase by several hours.   It is important to take naps and go to bed earlier.  Even just putting your feet up for a few minutes every hour or so helps.    Heartburn  Heartburn is a burning feeling in your chest.  During early pregnancy, stomach acid can back up into your esophagus due to a change in your hormone levels.  Things you can do to help relieve heartburn are:  " Avoid eating large meals and greasy, spicy, fatty foods.  " Avoid chewing gum.  " Do not lie down, bend over or stoop for 2 hours after eating.   " Raise the head of your bed 6 inches. You can use a couple of blocks of wood to do this.   " Avoid tight clothing around your abdomen or waist.  " Check with your HCP about an antacid that is safe to use.     Constipation  Constipation (going too long without having a bowel movement) happens because your hormone levels  slow down the normal activity of your gastrointestinal (GI) tract, especially your intestines.  Things that help include:  " Increasing the "roughage" or fiber that you eat.  Eat more fruit, vegetables, dried fruits and bran or whole grain foods.  " Drinking more fluids-at least eight tall glasses of liquids each day.  Water is the best, but any liquids will help.  " Increasing your activity. Even taking an extra 15 to 30 minute walk each day can help.  " Maintaining regular bowel habits.  Try to have a bowel movement at the same time every day, but do not strain (push really hard) to get it out.  Sometimes a hot drink helps.  " Checking with your HCP about using a stool softener:  these medications make your bowel movement easier to get out.   Do no use laxative or enemas unless your HCP has ordered a special kind for you,    Frequent Urination  During early pregnancy you will notice that you have to go to the bathroom to urinate more often.  This is due to hormones in your body and to the growing uterus pushing on your bladder.  This frequency is   normal but tell your HCP if you notice any burning or pain when you urinate.     Breast Tenderness  Your breasts get larger during pregnancy as the milk glands develop.  Sometime you feel a tingling or throbbing sensation.  These changes are normal.   If you wear a bra, make sure it supports you without being too tight.     Vaginal Discharge  The change in your hormone levels makes a change in the cells in your vagina.   This causes an increase in vaginal secretions.  The discharge  is usually thin and clear or white in color.  It should not have any odor.  Things you can do to help yourself feel more comfortable include the following:  " Keep your vulva (private area) clean and dry.  " Do not wear tight clothing or too many layers of clothing.   " Wear cotton underwear  " Do not douche or use feminine hygiene products or strong soaps.  " Report irritation, odor or a  colored discharge to your HCP.    Warning Signs  Most women do not have problems during their pregnancy; however, sometimes a problem can occur.  Be sure to call your health care provider (HCP) right away so that any possible treatment can be started.  Warning signs in the first trimester include:  " Leakage of fluid from the vagina  " Bleeding from the vagina, with or without passing any blood clots or tissue.  " Fever of more than 100.4 degrees F or 38.0 degrees C  " Abdominal or pelvic (area between your hips) pain   " Severe back pain that does not go away with rest  " Nausea and vomiting that lasts more than 1 day.  " Tea-colored urine or not urinating often enough  " Severe headache  " Fainting or blackouts    If you have any of these symptoms, call our office right away.     Reviewed 03/2010

## 2015-07-06 NOTE — Progress Notes (Signed)
07/10/15 late entry. The previous nursing note was not saved by e-records.  Carrie Velazquez is a 25 y.o. G2P1001. LMP was 05/04/2015 She is at [redacted]w[redacted]d   gestation. Patient reports nausea.    Social History:  Patient arrives alone. Patient has a stable home environment. Father of the baby is is involved with the pregnancy.Domestic violence:No.  Social work referral was not made.    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    OB History     Gravida Para Term Preterm AB TAB SAB Ectopic Multiple Living    Genetic Screening  Genetic Screening/Teratology Counseling- Includes patient, baby's father, or anyone in either family with:  Patients age 82 years or older as of estimated date of delivery: No  Thalassemia --Italian-Greek-Mediterranean or Asian backgroud--: MCV less than 60: No  Neural tube defect --Meningomyelocele-Spina bifida or Anencephaly-- : No  Congenital heart defect: No  Down syndrome: No  Tay-Sachs --Ashkenazi Jewish-Cajun-French [place]--: No  Canavan disease --Ashkenazi Jewish--: No  Familial dysautonomia --Ashkenazi Jewish--: No  Sickle cell disease or trait --African--: No  Hemophilia or other blood  disorders: No  Muscular dystrophy: No  Cystic fibrosis: Yes  Huntingtons chorea: No  Mental retardation/autism: No  Other inherited genetic or chromosomal disorder: No  Maternal metabolic disorder --eg. Type 1 diabetes-PKU--: No  Patient or babys father had child with birth defects not listed above: No  Recurrent pregnancy loss0  Medications --including supplements- vitamins- herbs or OTC drugs--/illicit/recreational drugs/alcohol since last menstrual period: Yes            If yesprenatal vitamins  Any other:     Depression screen - Edinburgh score:     Plan:                                                                                               1. Pt oriented to [employer]. Discussed variety of health care professionals  that she may see during her prenatal care including physicians, midwives, nurse practitioners, resident physicians, and medical students. Given phone numbers to reach [employer]. Discussed during weekday hours, patient likely to speak with nurse and during nights and weekends, a covering physician at [facility] labor and delivery available for emergencies.  2. Offered Centering Yes,declined  3. OB folder given and reviewed Yes  4. Discussed warning signs and symptoms and things to call for including but not limited to vaginal bleeding, leakage of fluid, contractions Yes  5. Reviewed safe in pregnancy medication list Yes  6. Discussed Toxoplasmosis precautions Yes  7. Reviewed Listeria precautions Yes  8. Reviewed healthy eating habits, foods to avoid during pregnancy, and healthy weight gain goals Yes  9. Reviewed avoidance of smoking, alcohol, illicit/recreational drugs   10. Offered Cystic Fibrosis testing Pt has tested +CF  11. Reviewed available genetic testing including first trimester screening, second trimester testing/AFP Yes  12. Flu shot: yes, pt declined   13. HIV pretest counseling for less than 8 minutes.    Bernita Buffy, RN

## 2015-07-07 LAB — PRENATAL PROFILE
Baso # K/uL: 0 10*3/uL (ref 0.0–0.1)
Basophil %: 0.4 %
Eos # K/uL: 0.1 10*3/uL (ref 0.0–0.4)
Eosinophil %: 0.9 %
HBV S Ag: NEGATIVE
Hematocrit: 34 % (ref 34–45)
Hemoglobin: 11.7 g/dL (ref 11.2–15.7)
IMM Granulocytes #: 0 10*3/uL (ref 0.0–0.1)
IMM Granulocytes: 0.3 %
Lymph # K/uL: 2.2 10*3/uL (ref 1.2–3.7)
Lymphocyte %: 28.8 %
MCH: 30 pg/cell (ref 26–32)
MCHC: 34 g/dL (ref 32–36)
MCV: 87 fL (ref 79–95)
Mono # K/uL: 0.7 10*3/uL (ref 0.2–0.9)
Monocyte %: 9.6 %
Neut # K/uL: 4.6 10*3/uL (ref 1.6–6.1)
Nucl RBC # K/uL: 0 10*3/uL (ref 0.0–0.0)
Nucl RBC %: 0 /100 WBC (ref 0.0–0.2)
Platelets: 240 10*3/uL (ref 160–370)
RBC: 3.9 MIL/uL (ref 3.9–5.2)
RDW: 12.3 % (ref 11.7–14.4)
Rubella IgG AB: POSITIVE
Seg Neut %: 60 %
Syphilis Screen: NEGATIVE
Syphilis Status: NONREACTIVE
WBC: 7.7 10*3/uL (ref 4.0–10.0)

## 2015-07-07 LAB — HIV 1&2 ANTIGEN/ANTIBODY: HIV 1&2 ANTIGEN/ANTIBODY: NONREACTIVE

## 2015-07-07 LAB — AEROBIC CULTURE: Aerobic Culture: 0

## 2015-07-10 LAB — CHLAMYDIA PLASMID DNA AMPLIFICATION: Chlamydia plasmid DNA AMplification: 0

## 2015-07-10 LAB — N. GONORRHOEAE DNA AMPLIFICATION: N. Gonorrhoeae DNA Amplification: 0

## 2015-07-12 LAB — GYN CYTOLOGY

## 2015-08-02 ENCOUNTER — Encounter: Payer: Self-pay | Admitting: Obstetrics and Gynecology

## 2015-08-02 ENCOUNTER — Ambulatory Visit: Payer: Self-pay | Admitting: Obstetrics and Gynecology

## 2015-08-02 VITALS — BP 100/63 | Ht 62.99 in | Wt 132.0 lb

## 2015-08-02 DIAGNOSIS — Z3A12 12 weeks gestation of pregnancy: Secondary | ICD-10-CM

## 2015-08-02 DIAGNOSIS — Z3480 Encounter for supervision of other normal pregnancy, unspecified trimester: Secondary | ICD-10-CM

## 2015-08-02 NOTE — Student Note (Signed)
[facility]    CC: OBC    Subjective:  Carrie Velazquez is a 25 y.o. G2P1001 female at [redacted]w[redacted]d who presents today for routine OB check.      She has no concerns. Her nausea and vomiting of early pregnancy has been improving. She denies cramping or vaginal bleeding.     OB History  G2P1001    Pregnancy Risks  - CF carrier, FOB tested negative  - h/o thyroid nodule with normal TSH and free T4    Objective:  PHYSICAL EXAM:  Vitals:    08/02/15 1248   BP: 100/63   Weight: 59.9 kg (132 lb)   Height: 1.6 m (5' 2.99")     See Flowsheet    Gen: well appearing pregnant female in NAD  Resp: normal respiratory effort  Abd: Gravid non-tender and Gravid, no fundal tenderness  Pelvic: deferred  Ext: No edema noted    Fetal Assessment  Doptones: in the 150s, difficult to assess due to location of fetus    ASSESSMENT:  25 y.o. G2P1001 at [redacted]w[redacted]d, with an uncomplicated pregnancy presents with no acute concerns.    PLAN:  1. Supervision of pregnancy  - declines flu vaccine and 1st trimester screen; counseled on the significant risk of flu infection during pregnancy  - rubella immune, Pap smear with negative cytology  - negative for HIV, syphilis, HBV, GC/CT  - will schedule anatomic ultrasound at [place] 18-20 weeks    Return to clinic in 4 weeks.  Discussed with Annetta Maw, NP    Leotis Shames [facility]a Setters [facility]   08/02/2015 3:21 PM  Pager [phone]    Note: This is a medical student note.  Please see attending/resident note for final assessment and plan.

## 2015-08-02 NOTE — Progress Notes (Signed)
Subjective:      Carrie Velazquez is a 26 y.o.G2P1001 female being seen today for her obstetrical visit at [redacted]w[redacted]d. Patient reports no bleeding and no cramping. Fetal movement: too early.    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      Objective:         Visit Vitals    BP 100/63    Ht 1.6 m (5' 2.99")    Wt 59.9 kg (132 lb)    LMP 05/04/2015 (Exact Date)    BMI 23.39 kg/m2     FHT: 150's    Assessment:     25 y.o.G2P1001 at [redacted]w[redacted]d who presents for routine OB visit.    ICD-10-CM ICD-9-CM   1. Prenatal care, subsequent pregnancy, unspecified trimester Z34.80 V22.1   2. [redacted] weeks gestation of pregnancy Z3A.12 V22.2         Plan:         Problem list reviewed and updated.  No problems updated.      Dispo:  She will follow-up in 4 weeks.      Xyler Terpening A Harcleroad, NP  08/02/2015  1:21 PM

## 2015-08-02 NOTE — Patient Instructions (Signed)
Common Discomforts:  SECOND TRIMESTER    You have made it through the first third of your pregnancy! The middle 3 months--the second trimester--are usually the easiest. Although some women still have nausea and feel especially tired for their whole pregnancy, most women are feeling much better.   Your growing baby and uterus can cause some discomforts, though.  Here are 4 common ones and ways to deal with them:    Back Pain  Lower back pain can be caused by muscle strain (from stretching, bending or lifting).   Sometimes your hormones change and leave you more at risk for back pain. Weak muscles also strain and hurt more easily.  Here are some things that can lesson you discomfort:   Use proper body mechanics by lifting with your back straight and your knees bent.  Do not twist while lifting or pulling.  Stand and sit with your back as straight as possible.    Avoid standing too long in one spot. If you can, rest one foot on a footstool or step while you are standing.  Change which foot you lean on every few minutes.    Wear a special support girdle for pregnant women.   You can find these at maternity shops or a medical supply store.    Use a footstool when you have to sit for long periods, so your legs are no dangling.   Ask your partner or a friend for a back rub, or use a heating pad on low setting to relieve aches. Sometimes a warm, not hot, bath feels good too.   Try to sleep on a firm, supportive mattress. Put a board under your mattress if it is too soft.   Use lost of pillows!  Wedge one under your belly when you are on your side and use one between your knees while on your side too.  Avoid lying flat on your back.  Lean into a pillow placed under your side instead.   Do pelvic tilt exercises.  On your hands and knees like an angry cat.  Rock forward and backward a little to stretch out your back.   On your back, bend your knees, then lift your bottom up in the air so only your head, shoulders and  feet touch the floor.  Hold this position for a few seconds, then relax and do it again.    Wear good walking shoes as much as possible.  Try not to wear shoes with a heel higher than 2 inches.  If your backache comes and goes in a regular pattern or if you have leakage or bleeding from your vagina, call your health care provider right away.    Hemorrhoids  Hemorrhoids are also called piles.  They are extra-large blood vessels in your rectum (where you have bowel movements) that itch and sometimes bleed.  They are caused    by the increased pressure of your baby in your pelvis (area between your hips).  The pressure of the baby blocks off the blood vessels and causes them to get too big.  Things that help keep hemorrhoids from happening to getting too bad include the following:   Avoid constipation.  Eat lots of fruits and vegetables so you have a bowel movement every day if possible.  Ask your health care provider if you need a laxative to help you have regular bowel movements.   Do not strain or push when having a bowel movement.  Breathe slowly, relax and let  it come out without pushing.  • Drink more fluids and eat foods high in fiber, like whole grains.  • Do not have anal sex (in which the man puts his penis in your anus, where bowel movements come out).  • Take stiz baths with cool water 2-3 times a day.  A sitz bath is a little tub that fits on your toilet and lets you soak your bottom without getting the rest of your body wet. You can buy a stiz bath kit at a drugstore or medical supply store.   • Use ice packs or witch hazel pads (like Tucks) to take the swelling down.   • Do Kegel exercises.  A Kegel exercise is when you tighten the muscles in your bottom. Next time you urinate, try to stop the urine.  The muscle tightening you do to stop your urine is a Kegel exercise.  After you figure out how, only do Kegel exercises when you are not urinating. Starting and stopping your urine flow too much can  lead to infections or bladder problems.  • Try to sit with your legs crossed tailor style (both feet tucked under the opposite leg, with the knees sticking out) or sit on hard chairs.  Do not sit too long in any chair.  • I you notice bleeding when you go to the bathroom, talk with your health care provider.     Leg Cramps  Leg cramps happen in pregnancy, but it is unclear why.  Things that can help prevent or lessen them include the following:  • Get more calcium in your diet.  Se the guide on Nutrition during Pregnancy for some ways of getting calcium in your diet. In addition to dairy products, there is calcium-fortified orange juice.  Ask your health care provider if you can take a calcium supplement (like Tums).   • Exercise.  Even walking 30 minutes a day can help.  And you can walk for 10 minutes 3 times a day for the same benefit.   • If you get a cramp, try straightening your leg.  Sometimes pointing your toe toward your knee helps.  Stand a foot or so from a wall and lean toward it keeping your knees straight and your feet flat on the floor.  • Massage or apply mild heat with a warm wash cloth or heating pad on low to the cramping area.  If you feel a hard knot or hot spot on your leg, especially in the calf (back of the lower leg that does not go away, call you health care provider right away.    Varicose Veins  Varicose veins show up as blue line under the skin usually on your lower legs.  Sometimes they are thick like cords.  They can be due to your hormones, to pressure on your pelvis from the baby, or to our family tendency (someone else in your family had them).  Things you can do to help present varicose veins includes the following:  • Avoid constrictive (tight) clothing, especially if it is tight in your waist or hip areas.  Also avoid knee socks or hose with elastic bands at the top.  • Try not to sit or stand for long periods without moving around.   • Use good posture and good body mechanics.   (See the section of this guide on back pain).    If you get varicose veins, these things can help keep them from bothering you or getting   you or getting worse:   Wear good support hose.  You can buy support hose at the maternity shops or medical supplies stores.    Raise your legs on a footstool when sitting.  Lie down and put your feet up higher than your head every chance you get.   Talk with your HCP about other things to keep you comfortable.     Many women never have any of these discomforts during their pregnancy.  Others have a lot of discomforts.  Be sure to tell your Health Care Provider about anything that you find uncomfortable.  Write down your questions as they come up so you will remember to ask them during your next office visit or phone call.      Warning Signs: SECOND TRIMESTER    No one wants something to go wrong during her pregnancy.  It can be really temping to not tell anyone when something happens; you just hope it will go away on its own.  Usually something that worries you is not a big problem but it is important to talk to your health care provider any time you have a worry.  Most of the time, bigger problems can be prevented if you get help early enough.  During your second trimester, there are three warning signs that you should call your Health Care Provider about:    Bleeding  After your twentieth week or fifth month of pregnancy, bleeding can be due to several causes.  There can be problems with the placenta.  Placenta previa is when the placenta forms across the cervix (opening to the uterus): placenta abruption is when the placenta pulls ways from the inside of the uterus.  Sometimes sores or scratches on your cervix or vagina can cause bleeding.  Vaginal infections or polyps (see the guide on Sexually Transmitted Diseases) can cause bleeding.  Preterm labor may also cause bleeding.      You may or may not have pain with vaginal bleeding.  It is very important to call your Health Care Provider  or go to the hospital to be examined any time you have vaginal bleeding during your pregnancy.    Pain  Abdominal or pelvic (between your hips) pain during your pregnancy needs to be checked out by your Health Care Provider.       Non-pregnancy causes of pain need to be considered along with pregnancy causes.  Back pain that comes and goes in a pattern or does not go away at all need to be checked out.  Call your Health Care Provider or if the pain is really bad or sudden, go to the hospital where you plan to deliver so you can be examined.     Infection and Fever  When you are pregnant, you can have normal colds and infections.  But it is important to tell your Health Care Provider if you have:   A fever of more than 100.4 degrees F or 38.0 C   A burning sensation or pain when you urinate or a need to go to the bathroom often   Green or yellow drainage from your nose, or a tendency to cough up green or yellow phlegm.   Toothaches, bleeding gums, or sores in the mouth that do not go away.   Vomiting or diarrhea that last more than an hour or so.   Any symptom that worries you    If you have a fever, you need to increase the amount of fluids you  you flush infection out of your body.  They also help keep you from getting dehydrated (dried out), which is bad for you and the baby. If you have an infection it is also best to get more rest so that your body has the energy to fight the germs.   Antibiotics (medicines that treat infections) are safe during pregnancy and may be necessary to protect your baby from any possible harm from your infection.  Your Health Care Provider will order medicine if you need it.     Remember--you and your Health Care Provider are a team, working to help you have a safe pregnancy and a healthy baby.  Be sure to let your Health Care Provider know of anything that concerns you.  Ignoring or worrying about a problem won’t help it to go away! Getting help right away  can make a big difference in your health and your baby’s safety.

## 2015-08-03 LAB — AEROBIC CULTURE: Aerobic Culture: 0

## 2015-08-14 ENCOUNTER — Telehealth: Payer: Self-pay

## 2015-08-14 NOTE — Telephone Encounter (Signed)
08/14/2015 Phone call from patient requesting provider write a letter for her for work. States she has to go [place] an [place] and needs letter regarding pregnancy. Writer inquired what type of letter she needed, patient advised something regarding mobility. "Something stating I can't stand in lines for two hours". Advised patient pregnancy isn't a disability. Advised we can write letter confirming pregnancy. Patient requesting something that includes limitations due to pregnancy. Advised will make provider aware of request and see what type of letter they can write for patient. Writer advised patient she is only 3819w4d realistically no restrictions. Patient to receive a call back. Marva PandaKeley J Tranice Laduke, RN

## 2015-08-14 NOTE — Telephone Encounter (Signed)
Note written for patient. [name], NP

## 2015-08-14 NOTE — Telephone Encounter (Signed)
08/14/2015 Advised patient of letter. Patient to pick up from office. Marva PandaKeley J Ardith Test, RN

## 2015-08-29 ENCOUNTER — Encounter: Payer: Self-pay | Admitting: Obstetrics and Gynecology

## 2015-08-29 ENCOUNTER — Ambulatory Visit: Payer: Self-pay | Admitting: Obstetrics and Gynecology

## 2015-08-29 DIAGNOSIS — Z3492 Encounter for supervision of normal pregnancy, unspecified, second trimester: Secondary | ICD-10-CM

## 2015-08-29 NOTE — Progress Notes (Signed)
Subjective:      Carrie Velazquez is a 25 y.o.G2P1001 female being seen today for her obstetrical visit at 2775w5d.  She is doing ok and feeling better than she did    She is not having bleeding or cramping    She declines the second trimester screen today    Patient reports no complaints. Fetal movement: normal.    Patient's medications, allergies, past medical, surgical, social and family histories were reviewed and updated as appropriate.    Pregnancy Risks:  See problem list    Review of Systems:  Pertinent items are noted in HPI.     Objective:        Visit Vitals    BP 105/58    Ht 1.6 m (5' 2.99")    Wt 60.8 kg (134 lb)    LMP 05/04/2015 (Exact Date)    BMI 23.74 kg/m2     FHT: 155 BPM   Uterine Size: 15 cm and size equals dates        Assessment:     25 y.o.G2P1001 at 7475w5d who presents for routine OB visit.  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    20 min face to face time spent with the patient; >50% of this time was spent on counseling and coordination of care.       Berniece AndreasJacqueline Duffy Dantonio, CNM  08/29/2015  2:10 PM

## 2015-09-17 ENCOUNTER — Ambulatory Visit: Payer: Self-pay

## 2015-09-17 ENCOUNTER — Ambulatory Visit
Admission: RE | Admit: 2015-09-17 | Discharge: 2015-09-17 | Disposition: A | Discharge status: Home / Self Care | Payer: Self-pay | Source: Ambulatory Visit | Admitting: Obstetrics and Gynecology

## 2015-09-17 ENCOUNTER — Other Ambulatory Visit: Payer: Self-pay | Admitting: Obstetrics and Gynecology

## 2015-09-17 DIAGNOSIS — E041 Nontoxic single thyroid nodule: Secondary | ICD-10-CM

## 2015-09-17 DIAGNOSIS — Z3492 Encounter for supervision of normal pregnancy, unspecified, second trimester: Secondary | ICD-10-CM

## 2015-09-18 ENCOUNTER — Telehealth: Payer: Self-pay

## 2015-09-18 NOTE — Telephone Encounter (Signed)
-----   Message from Berniece AndreasJacqueline Nasso, [place] sent at 09/17/2015  3:16 PM EDT -----  Pt needs f/u scan, routed to nurses to call pt and advise, thank you, will order

## 2015-09-18 NOTE — Telephone Encounter (Signed)
T/C to patient. Advised of need to repeat US. Patient will schedule.

## 2015-09-26 ENCOUNTER — Ambulatory Visit: Payer: Self-pay | Admitting: Obstetrics and Gynecology

## 2015-09-26 ENCOUNTER — Encounter: Payer: Self-pay | Admitting: Obstetrics and Gynecology

## 2015-09-26 VITALS — BP 100/60 | Ht 62.99 in | Wt 137.0 lb

## 2015-09-26 DIAGNOSIS — Z3480 Encounter for supervision of other normal pregnancy, unspecified trimester: Secondary | ICD-10-CM

## 2015-09-26 DIAGNOSIS — Z3A20 20 weeks gestation of pregnancy: Secondary | ICD-10-CM

## 2015-09-26 NOTE — Patient Instructions (Signed)
Common Discomforts:  SECOND TRIMESTER    You have made it through the first third of your pregnancy! The middle 3 months--the second trimester--are usually the easiest. Although some women still have nausea and feel especially tired for their whole pregnancy, most women are feeling much better.   Your growing baby and uterus can cause some discomforts, though.  Here are 4 common ones and ways to deal with them:    Back Pain  Lower back pain can be caused by muscle strain (from stretching, bending or lifting).   Sometimes your hormones change and leave you more at risk for back pain. Weak muscles also strain and hurt more easily.  Here are some things that can lesson you discomfort:   Use proper body mechanics by lifting with your back straight and your knees bent.  Do not twist while lifting or pulling.  Stand and sit with your back as straight as possible.    Avoid standing too long in one spot. If you can, rest one foot on a footstool or step while you are standing.  Change which foot you lean on every few minutes.    Wear a special support girdle for pregnant women.   You can find these at maternity shops or a medical supply store.    Use a footstool when you have to sit for long periods, so your legs are no dangling.   Ask your partner or a friend for a back rub, or use a heating pad on low setting to relieve aches. Sometimes a warm, not hot, bath feels good too.   Try to sleep on a firm, supportive mattress. Put a board under your mattress if it is too soft.   Use lost of pillows!  Wedge one under your belly when you are on your side and use one between your knees while on your side too.  Avoid lying flat on your back.  Lean into a pillow placed under your side instead.   Do pelvic tilt exercises.  On your hands and knees like an angry cat.  Rock forward and backward a little to stretch out your back.   On your back, bend your knees, then lift your bottom up in the air so only your head, shoulders and  feet touch the floor.  Hold this position for a few seconds, then relax and do it again.    Wear good walking shoes as much as possible.  Try not to wear shoes with a heel higher than 2 inches.  If your backache comes and goes in a regular pattern or if you have leakage or bleeding from your vagina, call your health care provider right away.    Hemorrhoids  Hemorrhoids are also called piles.  They are extra-large blood vessels in your rectum (where you have bowel movements) that itch and sometimes bleed.  They are caused    by the increased pressure of your baby in your pelvis (area between your hips).  The pressure of the baby blocks off the blood vessels and causes them to get too big.  Things that help keep hemorrhoids from happening to getting too bad include the following:   Avoid constipation.  Eat lots of fruits and vegetables so you have a bowel movement every day if possible.  Ask your health care provider if you need a laxative to help you have regular bowel movements.   Do not strain or push when having a bowel movement.  Breathe slowly, relax and let  it come out without pushing.  • Drink more fluids and eat foods high in fiber, like whole grains.  • Do not have anal sex (in which the man puts his penis in your anus, where bowel movements come out).  • Take stiz baths with cool water 2-3 times a day.  A sitz bath is a little tub that fits on your toilet and lets you soak your bottom without getting the rest of your body wet. You can buy a stiz bath kit at a drugstore or medical supply store.   • Use ice packs or witch hazel pads (like Tucks) to take the swelling down.   • Do Kegel exercises.  A Kegel exercise is when you tighten the muscles in your bottom. Next time you urinate, try to stop the urine.  The muscle tightening you do to stop your urine is a Kegel exercise.  After you figure out how, only do Kegel exercises when you are not urinating. Starting and stopping your urine flow too much can  lead to infections or bladder problems.  • Try to sit with your legs crossed tailor style (both feet tucked under the opposite leg, with the knees sticking out) or sit on hard chairs.  Do not sit too long in any chair.  • I you notice bleeding when you go to the bathroom, talk with your health care provider.     Leg Cramps  Leg cramps happen in pregnancy, but it is unclear why.  Things that can help prevent or lessen them include the following:  • Get more calcium in your diet.  Se the guide on Nutrition during Pregnancy for some ways of getting calcium in your diet. In addition to dairy products, there is calcium-fortified orange juice.  Ask your health care provider if you can take a calcium supplement (like Tums).   • Exercise.  Even walking 30 minutes a day can help.  And you can walk for 10 minutes 3 times a day for the same benefit.   • If you get a cramp, try straightening your leg.  Sometimes pointing your toe toward your knee helps.  Stand a foot or so from a wall and lean toward it keeping your knees straight and your feet flat on the floor.  • Massage or apply mild heat with a warm wash cloth or heating pad on low to the cramping area.  If you feel a hard knot or hot spot on your leg, especially in the calf (back of the lower leg that does not go away, call you health care provider right away.    Varicose Veins  Varicose veins show up as blue line under the skin usually on your lower legs.  Sometimes they are thick like cords.  They can be due to your hormones, to pressure on your pelvis from the baby, or to our family tendency (someone else in your family had them).  Things you can do to help present varicose veins includes the following:  • Avoid constrictive (tight) clothing, especially if it is tight in your waist or hip areas.  Also avoid knee socks or hose with elastic bands at the top.  • Try not to sit or stand for long periods without moving around.   • Use good posture and good body mechanics.   (See the section of this guide on back pain).    If you get varicose veins, these things can help keep them from bothering you or getting   you or getting worse:   Wear good support hose.  You can buy support hose at the maternity shops or medical supplies stores.    Raise your legs on a footstool when sitting.  Lie down and put your feet up higher than your head every chance you get.   Talk with your HCP about other things to keep you comfortable.     Many women never have any of these discomforts during their pregnancy.  Others have a lot of discomforts.  Be sure to tell your Health Care Provider about anything that you find uncomfortable.  Write down your questions as they come up so you will remember to ask them during your next office visit or phone call.      Warning Signs: SECOND TRIMESTER    No one wants something to go wrong during her pregnancy.  It can be really temping to not tell anyone when something happens; you just hope it will go away on its own.  Usually something that worries you is not a big problem but it is important to talk to your health care provider any time you have a worry.  Most of the time, bigger problems can be prevented if you get help early enough.  During your second trimester, there are three warning signs that you should call your Health Care Provider about:    Bleeding  After your twentieth week or fifth month of pregnancy, bleeding can be due to several causes.  There can be problems with the placenta.  Placenta previa is when the placenta forms across the cervix (opening to the uterus): placenta abruption is when the placenta pulls ways from the inside of the uterus.  Sometimes sores or scratches on your cervix or vagina can cause bleeding.  Vaginal infections or polyps (see the guide on Sexually Transmitted Diseases) can cause bleeding.  Preterm labor may also cause bleeding.      You may or may not have pain with vaginal bleeding.  It is very important to call your Health Care Provider  or go to the hospital to be examined any time you have vaginal bleeding during your pregnancy.    Pain  Abdominal or pelvic (between your hips) pain during your pregnancy needs to be checked out by your Health Care Provider.       Non-pregnancy causes of pain need to be considered along with pregnancy causes.  Back pain that comes and goes in a pattern or does not go away at all need to be checked out.  Call your Health Care Provider or if the pain is really bad or sudden, go to the hospital where you plan to deliver so you can be examined.     Infection and Fever  When you are pregnant, you can have normal colds and infections.  But it is important to tell your Health Care Provider if you have:   A fever of more than 100.4 degrees F or 38.0 C   A burning sensation or pain when you urinate or a need to go to the bathroom often   Green or yellow drainage from your nose, or a tendency to cough up green or yellow phlegm.   Toothaches, bleeding gums, or sores in the mouth that do not go away.   Vomiting or diarrhea that last more than an hour or so.   Any symptom that worries you    If you have a fever, you need to increase the amount of fluids you  you flush infection out of your body.  They also help keep you from getting dehydrated (dried out), which is bad for you and the baby. If you have an infection it is also best to get more rest so that your body has the energy to fight the germs.   Antibiotics (medicines that treat infections) are safe during pregnancy and may be necessary to protect your baby from any possible harm from your infection.  Your Health Care Provider will order medicine if you need it.     Remember--you and your Health Care Provider are a team, working to help you have a safe pregnancy and a healthy baby.  Be sure to let your Health Care Provider know of anything that concerns you.  Ignoring or worrying about a problem won’t help it to go away! Getting help right away  can make a big difference in your health and your baby’s safety.

## 2015-09-26 NOTE — Progress Notes (Signed)
Subjective:      Carrie Velazquez is a 25 y.o.G2P1001 female being seen today for her obstetrical visit at 3979w5d.  Patient reports no bleeding, no contractions, no cramping and no leaking. Fetal movement: normal.    Patient's medications, allergies, past medical, surgical, social and family histories were reviewed and updated as appropriate.    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     Objective:        Visit Vitals    BP 100/60    Ht 1.6 m (5' 2.99")    Wt 62.1 kg (137 lb)    LMP 05/04/2015 (Exact Date)    BMI 24.27 kg/m2     FHT: 155 BPM   Uterine Size: size equals dates        Assessment:     25 y.o.G2P1001 at 6179w5d who presents for routine OB visit.    ICD-10-CM ICD-9-CM   1. Prenatal care, subsequent pregnancy, unspecified trimester Z34.80 V22.1   2. [redacted] weeks gestation of pregnancy Z3A.20 V22.2         Plan:        Reviewed how and when to call  Follow-up anatomic ultrasound: ordered    Dispo:  She will follow-up in 4 weeks.      Jeralyn Nolden A Harcleroad, NP  09/26/2015  2:16 PM

## 2015-10-01 ENCOUNTER — Encounter: Payer: Self-pay | Admitting: Obstetrics and Gynecology

## 2015-10-01 ENCOUNTER — Ambulatory Visit: Payer: Self-pay

## 2015-10-01 DIAGNOSIS — Z3492 Encounter for supervision of normal pregnancy, unspecified, second trimester: Secondary | ICD-10-CM

## 2015-10-01 DIAGNOSIS — O3660X0 Maternal care for excessive fetal growth, unspecified trimester, not applicable or unspecified: Secondary | ICD-10-CM | POA: Insufficient documentation

## 2015-10-04 ENCOUNTER — Encounter: Payer: Self-pay | Admitting: Obstetrics and Gynecology

## 2015-10-04 ENCOUNTER — Telehealth: Payer: Self-pay

## 2015-10-04 ENCOUNTER — Ambulatory Visit: Payer: Self-pay | Admitting: Obstetrics and Gynecology

## 2015-10-04 VITALS — BP 115/75 | Ht 62.99 in | Wt 139.0 lb

## 2015-10-04 DIAGNOSIS — R109 Unspecified abdominal pain: Secondary | ICD-10-CM

## 2015-10-04 MED ORDER — NONFORMULARY (OTHER) ORDER *I*
0 refills | Status: AC
Start: 2015-10-04 — End: ?

## 2015-10-04 NOTE — Telephone Encounter (Signed)
10/04/2015 G2P1001 Patient currently 2331w6d pregnant. Calling with complaints of not being able to move without being in severe pain. States pain has been worsening over the past week. States when she tries to move she hears cracking, states cracking oocurs all the way down her legs. Complains of pubic pain. "its really hard to explain the pain". Denies vaginal bleeding, no leaking of fluid, positive fetal movement. Denies contractions. Given patient complaints discussed symphysis pubis. Advised she needed to be evaluated, given patient pain and discomfort appointment scheduled for today to be evaluated. Marva PandaKeley J Miaya Lafontant, RN

## 2015-10-04 NOTE — Patient Instructions (Signed)
wOCommon Discomforts:  SECOND TRIMESTER    You have made it through the first third of your pregnancy! The middle 3 months--the second trimester--are usually the easiest. Although some women still have nausea and feel especially tired for their whole pregnancy, most women are feeling much better.   Your growing baby and uterus can cause some discomforts, though.  Here are 4 common ones and ways to deal with them:    Back Pain  Lower back pain can be caused by muscle strain (from stretching, bending or lifting).   Sometimes your hormones change and leave you more at risk for back pain. Weak muscles also strain and hurt more easily.  Here are some things that can lesson you discomfort:   Use proper body mechanics by lifting with your back straight and your knees bent.  Do not twist while lifting or pulling.  Stand and sit with your back as straight as possible.    Avoid standing too long in one spot. If you can, rest one foot on a footstool or step while you are standing.  Change which foot you lean on every few minutes.    Wear a special support girdle for pregnant women.   You can find these at maternity shops or a medical supply store.    Use a footstool when you have to sit for long periods, so your legs are no dangling.   Ask your partner or a friend for a back rub, or use a heating pad on low setting to relieve aches. Sometimes a warm, not hot, bath feels good too.   Try to sleep on a firm, supportive mattress. Put a board under your mattress if it is too soft.   Use lost of pillows!  Wedge one under your belly when you are on your side and use one between your knees while on your side too.  Avoid lying flat on your back.  Lean into a pillow placed under your side instead.   Do pelvic tilt exercises.  On your hands and knees like an angry cat.  Rock forward and backward a little to stretch out your back.   On your back, bend your knees, then lift your bottom up in the air so only your head, shoulders  and feet touch the floor.  Hold this position for a few seconds, then relax and do it again.    Wear good walking shoes as much as possible.  Try not to wear shoes with a heel higher than 2 inches.  If your backache comes and goes in a regular pattern or if you have leakage or bleeding from your vagina, call your health care provider right away.    Hemorrhoids  Hemorrhoids are also called piles.  They are extra-large blood vessels in your rectum (where you have bowel movements) that itch and sometimes bleed.  They are caused    by the increased pressure of your baby in your pelvis (area between your hips).  The pressure of the baby blocks off the blood vessels and causes them to get too big.  Things that help keep hemorrhoids from happening to getting too bad include the following:   Avoid constipation.  Eat lots of fruits and vegetables so you have a bowel movement every day if possible.  Ask your health care provider if you need a laxative to help you have regular bowel movements.   Do not strain or push when having a bowel movement.  Breathe slowly, relax and let  it come out without pushing.   Drink more fluids and eat foods high in fiber, like whole grains.   Do not have anal sex (in which the man puts his penis in your anus, where bowel movements come out).   Take stiz baths with cool water 2-3 times a day.  A sitz bath is a little tub that fits on your toilet and lets you soak your bottom without getting the rest of your body wet. You can buy a Personnel officer at [employer] or medical supply store.    Use ice packs or witch hazel pads (like Tucks) to take the swelling down.    Do Kegel exercises.  A Kegel exercise is when you tighten the muscles in your bottom. Next time you urinate, try to stop the urine.  The muscle tightening you do to stop your urine is a Kegel exercise.  After you figure out how, only do Kegel exercises when you are not urinating. Starting and stopping your urine flow too much  can lead to infections or bladder problems.   Try to sit with your legs crossed tailor style (both feet tucked under the opposite leg, with the knees sticking out) or sit on hard chairs.  Do not sit too long in any chair.   I you notice bleeding when you go to the bathroom, talk with your health care provider.     Leg Cramps  Leg cramps happen in pregnancy, but it is unclear why.  Things that can help prevent or lessen them include the following:   Get more calcium in your diet.  Se the guide on Nutrition during Pregnancy for some ways of getting calcium in your diet. In addition to dairy products, there is calcium-fortified orange juice.  Ask your health care provider if you can take a calcium supplement (like Tums).    Exercise.  Even walking 30 minutes a day can help.  And you can walk for 10 minutes 3 times a day for the same benefit.    If you get a cramp, try straightening your leg.  Sometimes pointing your toe toward your knee helps.  Stand a foot or so from a wall and lean toward it keeping your knees straight and your feet flat on the floor.   Massage or apply mild heat with a warm wash cloth or heating pad on low to the cramping area.  If you feel a hard knot or hot spot on your leg, especially in the calf (back of the lower leg that does not go away, call you health care provider right away.    Varicose Veins  Varicose veins show up as blue line under the skin usually on your lower legs.  Sometimes they are thick like cords.  They can be due to your hormones, to pressure on your pelvis from the baby, or to our family tendency (someone else in your family had them).  Things you can do to help present varicose veins includes the following:   Avoid constrictive (tight) clothing, especially if it is tight in your waist or hip areas.  Also avoid knee socks or hose with elastic bands at the top.   Try not to sit or stand for long periods without moving around.    Use good posture and good body  mechanics.  (See the section of this guide on back pain).    If you get varicose veins, these things can help keep them from bothering you or getting  worse:   Wear good support hose.  You can buy support hose at the maternity shops or medical supplies stores.    Raise your legs on a footstool when sitting.  Lie down and put your feet up higher than your head every chance you get.   Talk with your HCP about other things to keep you comfortable.     Many women never have any of these discomforts during their pregnancy.  Others have a lot of discomforts.  Be sure to tell your Health Care Provider about anything that you find uncomfortable.  Write down your questions as they come up so you will remember to ask them during your next office visit or phone call.      Warning Signs: SECOND TRIMESTER    No one wants something to go wrong during her pregnancy.  It can be really temping to not tell anyone when something happens; you just hope it will go away on its own.  Usually something that worries you is not a big problem but it is important to talk to your health care provider any time you have a worry.  Most of the time, bigger problems can be prevented if you get help early enough.  During your second trimester, there are three warning signs that you should call your Health Care Provider about:    Bleeding  After your twentieth week or fifth month of pregnancy, bleeding can be due to several causes.  There can be problems with the placenta.  Placenta previa is when the placenta forms across the cervix (opening to the uterus): placenta abruption is when the placenta pulls ways from the inside of the uterus.  Sometimes sores or scratches on your cervix or vagina can cause bleeding.  Vaginal infections or polyps (see the guide on Sexually Transmitted Diseases) can cause bleeding.  Preterm labor may also cause bleeding.      You may or may not have pain with vaginal bleeding.  It is very important to call your Health  Care Provider or go to the hospital to be examined any time you have vaginal bleeding during your pregnancy.    Pain  Abdominal or pelvic (between your hips) pain during your pregnancy needs to be checked out by your Health Care Provider.       Non-pregnancy causes of pain need to be considered along with pregnancy causes.  Back pain that comes and goes in a pattern or does not go away at all need to be checked out.  Call your Health Care Provider or if the pain is really bad or sudden, go to the hospital where you plan to deliver so you can be examined.     Infection and Fever  When you are pregnant, you can have normal colds and infections.  But it is important to tell your Health Care Provider if you have:   A fever of more than 100.4 degrees F or 38.0 C   A burning sensation or pain when you urinate or a need to go to the bathroom often   Green or yellow drainage from your nose, or a tendency to cough up green or yellow phlegm.   Toothaches, bleeding gums, or sores in the mouth that do not go away.   Vomiting or diarrhea that last more than an hour or so.   Any symptom that worries you    If you have a fever, you need to increase the amount of fluids you drink. Fluids help  you flush infection out of your body.  They also help keep you from getting dehydrated (dried out), which is bad for you and the baby. If you have an infection it is also best to get more rest so that your body has the energy to fight the germs.   Antibiotics (medicines that treat infections) are safe during pregnancy and may be necessary to protect your baby from any possible harm from your infection.  Your Health Care Provider will order medicine if you need it.     Remember--you and your Health Care Provider are a team, working to help you have a safe pregnancy and a healthy baby.  Be sure to let your Health Care Provider know of anything that concerns you.  Ignoring or worrying about a problem wont help it to go away! Getting help  right away can make a big difference in your health and your babys safety.

## 2015-10-04 NOTE — Progress Notes (Signed)
CC: acute ob visit.     HPI: This is a 25 y.o. G2P1001 at 1468w6d who is here for abdominal pain that occurred last night and this morning. Her pain is gone now but she does feel it while walking. Denies fever or chills. No nausea or vomiting. +FM. No contractions or LOF. No vaginal bleeding. Not sexually active. She presents today with her 27 year old daughter. Denies dysuria.       Patient's medications, allergies, past medical, surgical, obstetrical, gynecologic, social and family histories were reviewed and updated as appropriate.  Problem list reviewed and updated.    REVIEW OF SYSTEMS  Gen: no fevers, no chills, no unintended weight loss  Resp: no cough, no SOB  CV: no chest pain, no dyspnea  Breast: no lumps, no tenderness, no nipple discharge  Abd: yes for abdominal pain, no nausea, no vomiting, no diarrhea, no constipation  Endo: no heat/cold intolerance  MSK: no joint pains, no muscle aches  Psych: no anxiety, no depression     Physical Exam     Vitals:    10/04/15 1411   BP: 115/75   Weight: 63 kg (139 lb)   Height: 1.6 m (5' 2.99")       General - alert, well appearing, and in no distress and Pleasant, well-appearing female, in NAD  HEENT - Normocepahlic, atraumatic.   Abdomen - Gravid uterus. no tenderness   Pelvic exam: Patient declined as she is not sure it will help.     A/P: This is a 25 y.o. who presents for an acute visit. Patient is a G2P1001 at 3968w6d with what most likely is round ligament pain. We reviewed discomfort in pregnancy. Discussed tylenol, heat pack. Pregnancy support belt Rx given. Reviewed calling and return precautions.     Judith BlonderMarwa Rhia Blatchford, MD  10/04/2015

## 2015-10-24 ENCOUNTER — Encounter: Payer: Self-pay | Admitting: Obstetrics and Gynecology

## 2015-10-24 ENCOUNTER — Ambulatory Visit: Payer: Commercial Managed Care - PPO | Attending: Obstetrics and Gynecology | Admitting: Obstetrics and Gynecology

## 2015-10-24 DIAGNOSIS — Z3492 Encounter for supervision of normal pregnancy, unspecified, second trimester: Secondary | ICD-10-CM

## 2015-10-24 NOTE — Progress Notes (Signed)
Subjective:      Carrie Velazquez is a 25 y.o.G2P1001 female being seen today for her obstetrical visit at [redacted]w[redacted]d.  Patient reports her hip bones "cracking" each morning when she gets up. Fetal movement: normal.    Patient's medications, allergies, past medical, surgical, social and family histories were reviewed and updated as appropriate.    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        Objective:        Visit Vitals    BP 100/60    Ht 1.6 m (5' 2.99")    Wt 64 kg (141 lb)    LMP 05/04/2015 (Exact Date)    BMI 24.98 kg/m2     BP: 100/60  Weight: 64 kg (141 lb) Body mass index is 24.98 kg/(m^2).   Total weight gain: Not found.  Fundal Height (cm): 26 cm  Fetal Heart Rate: 156       SVE:  deferred      Assessment:     25 y.o.G2P1001 at [redacted]w[redacted]d who presents for routine OB visit.    Plan:     ORDERED TESTING:     Orders Placed This Encounter   Procedures    Hematocrit     Standing Status:   Future     Standing Expiration Date:   12/23/2015    Glucose tolerance, 1 hour     Standing Status:   Future     Standing Expiration Date:   12/23/2015       Diagnoses include:  1. Prenatal care, second trimester  Hematocrit    Glucose tolerance, 1 hour        Problem list reviewed and updated.  Problem   Prenatal Care    CLINIC SITE: COB  Varicella status: childhood history  Dental care: Has not had a cleaning in a long time, resistant to dental care.     EDC determined by                            Immunizations      Flu vaccine: declines   TDaP vaccine: (Last received in the hospital 2012)    Genetic Screening        Declined    Baseline / first trimester      Blood type:  07/06/15 -- A+   Rubella status: 07/06/15 -- Immune   Hep B, syphilis, HIV screens:  Negative x3 on 07/06/15 (reviewed with patient)   Urine culture:   08/02/15 negative    UCDS:   07/06/15 negative   GC/CT DNA amplifications: negative  07/06/15   Cystic fibrosis mutation screening:  done in 2011, CF carrier (reports that her husband is CF negative)   Hemoglobin electrophoresis: Normal 2011    Second trimester      Anatomic ultrasound: completed 10/01/15   Repeat CBC:  Ordered 10/24/2015.        Lab results: 07/06/15  1232   Hematocrit 34       1-hour glucose tolerance test:  Ordered 10/24/2015.    No results for input(s): 1SCRN in the last 8760 hours.      Third trimester / L&D planning      GBS:     No results for input(s): GBS, GBSS in the last 8760 hours.   Infant gender: female, "Ree Kida"    Circumcision: desires  [x]  Feeding plan: breast feeding (she breast fed her daughter for three months)  []  Postpartum contraception: Lengthy discussion at 10/24/2015 visit regarding contraceptive options.  She is considering BTL.  Discussed PPBTl, implications  [x]  Pediatrician: She is transitioning pediatricians.       Dispo:  She will follow-up in 4 weeks.    20 min face to face time spent with the patient; >50% of this time was spent on counseling and coordination of care.    Louie BunOLBY A Evyn Kooyman, MD  10/24/2015  1:52 PM

## 2015-10-24 NOTE — Patient Instructions (Signed)
wOCommon Discomforts:  SECOND TRIMESTER    You have made it through the first third of your pregnancy! The middle 3 months--the second trimester--are usually the easiest. Although some women still have nausea and feel especially tired for their whole pregnancy, most women are feeling much better.   Your growing baby and uterus can cause some discomforts, though.  Here are 4 common ones and ways to deal with them:    Back Pain  Lower back pain can be caused by muscle strain (from stretching, bending or lifting).   Sometimes your hormones change and leave you more at risk for back pain. Weak muscles also strain and hurt more easily.  Here are some things that can lesson you discomfort:   Use proper body mechanics by lifting with your back straight and your knees bent.  Do not twist while lifting or pulling.  Stand and sit with your back as straight as possible.    Avoid standing too long in one spot. If you can, rest one foot on a footstool or step while you are standing.  Change which foot you lean on every few minutes.    Wear a special support girdle for pregnant women.   You can find these at maternity shops or a medical supply store.    Use a footstool when you have to sit for long periods, so your legs are no dangling.   Ask your partner or a friend for a back rub, or use a heating pad on low setting to relieve aches. Sometimes a warm, not hot, bath feels good too.   Try to sleep on a firm, supportive mattress. Put a board under your mattress if it is too soft.   Use lost of pillows!  Wedge one under your belly when you are on your side and use one between your knees while on your side too.  Avoid lying flat on your back.  Lean into a pillow placed under your side instead.   Do pelvic tilt exercises.  On your hands and knees like an angry cat.  Rock forward and backward a little to stretch out your back.   On your back, bend your knees, then lift your bottom up in the air so only your head, shoulders  and feet touch the floor.  Hold this position for a few seconds, then relax and do it again.    Wear good walking shoes as much as possible.  Try not to wear shoes with a heel higher than 2 inches.  If your backache comes and goes in a regular pattern or if you have leakage or bleeding from your vagina, call your health care provider right away.    Hemorrhoids  Hemorrhoids are also called piles.  They are extra-large blood vessels in your rectum (where you have bowel movements) that itch and sometimes bleed.  They are caused    by the increased pressure of your baby in your pelvis (area between your hips).  The pressure of the baby blocks off the blood vessels and causes them to get too big.  Things that help keep hemorrhoids from happening to getting too bad include the following:   Avoid constipation.  Eat lots of fruits and vegetables so you have a bowel movement every day if possible.  Ask your health care provider if you need a laxative to help you have regular bowel movements.   Do not strain or push when having a bowel movement.  Breathe slowly,  relax and let it come out without pushing.   Drink more fluids and eat foods high in fiber, like whole grains.   Do not have anal sex (in which the man puts his penis in your anus, where bowel movements come out).   Take stiz baths with cool water 2-3 times a day.  A sitz bath is a little tub that fits on your toilet and lets you soak your bottom without getting the rest of your body wet. You can buy a Personnel officer at [employer] or medical supply store.    Use ice packs or witch hazel pads (like Tucks) to take the swelling down.    Do Kegel exercises.  A Kegel exercise is when you tighten the muscles in your bottom. Next time you urinate, try to stop the urine.  The muscle tightening you do to stop your urine is a Kegel exercise.  After you figure out how, only do Kegel exercises when you are not urinating. Starting and stopping your urine flow too much  can lead to infections or bladder problems.   Try to sit with your legs crossed tailor style (both feet tucked under the opposite leg, with the knees sticking out) or sit on hard chairs.  Do not sit too long in any chair.   I you notice bleeding when you go to the bathroom, talk with your health care provider.     Leg Cramps  Leg cramps happen in pregnancy, but it is unclear why.  Things that can help prevent or lessen them include the following:   Get more calcium in your diet.  Se the guide on Nutrition during Pregnancy for some ways of getting calcium in your diet. In addition to dairy products, there is calcium-fortified orange juice.  Ask your health care provider if you can take a calcium supplement (like Tums).    Exercise.  Even walking 30 minutes a day can help.  And you can walk for 10 minutes 3 times a day for the same benefit.    If you get a cramp, try straightening your leg.  Sometimes pointing your toe toward your knee helps.  Stand a foot or so from a wall and lean toward it keeping your knees straight and your feet flat on the floor.   Massage or apply mild heat with a warm wash cloth or heating pad on low to the cramping area.  If you feel a hard knot or hot spot on your leg, especially in the calf (back of the lower leg that does not go away, call you health care provider right away.    Varicose Veins  Varicose veins show up as blue line under the skin usually on your lower legs.  Sometimes they are thick like cords.  They can be due to your hormones, to pressure on your pelvis from the baby, or to our family tendency (someone else in your family had them).  Things you can do to help present varicose veins includes the following:   Avoid constrictive (tight) clothing, especially if it is tight in your waist or hip areas.  Also avoid knee socks or hose with elastic bands at the top.   Try not to sit or stand for long periods without moving around.    Use good posture and good body  mechanics.  (See the section of this guide on back pain).    If you get varicose veins, these things can help keep them from bothering  worse:   Wear good support hose.  You can buy support hose at the maternity shops or medical supplies stores.    Raise your legs on a footstool when sitting.  Lie down and put your feet up higher than your head every chance you get.   Talk with your HCP about other things to keep you comfortable.     Many women never have any of these discomforts during their pregnancy.  Others have a lot of discomforts.  Be sure to tell your Health Care Provider about anything that you find uncomfortable.  Write down your questions as they come up so you will remember to ask them during your next office visit or phone call.      Warning Signs: SECOND TRIMESTER    No one wants something to go wrong during her pregnancy.  It can be really temping to not tell anyone when something happens; you just hope it will go away on its own.  Usually something that worries you is not a big problem but it is important to talk to your health care provider any time you have a worry.  Most of the time, bigger problems can be prevented if you get help early enough.  During your second trimester, there are three warning signs that you should call your Health Care Provider about:    Bleeding  After your twentieth week or fifth month of pregnancy, bleeding can be due to several causes.  There can be problems with the placenta.  Placenta previa is when the placenta forms across the cervix (opening to the uterus): placenta abruption is when the placenta pulls ways from the inside of the uterus.  Sometimes sores or scratches on your cervix or vagina can cause bleeding.  Vaginal infections or polyps (see the guide on Sexually Transmitted Diseases) can cause bleeding.  Preterm labor may also cause bleeding.      You may or may not have pain with vaginal bleeding.  It is very important to call your Health  Care Provider or go to the hospital to be examined any time you have vaginal bleeding during your pregnancy.    Pain  Abdominal or pelvic (between your hips) pain during your pregnancy needs to be checked out by your Health Care Provider.       Non-pregnancy causes of pain need to be considered along with pregnancy causes.  Back pain that comes and goes in a pattern or does not go away at all need to be checked out.  Call your Health Care Provider or if the pain is really bad or sudden, go to the hospital where you plan to deliver so you can be examined.     Infection and Fever  When you are pregnant, you can have normal colds and infections.  But it is important to tell your Health Care Provider if you have:   A fever of more than 100.4 degrees F or 38.0 C   A burning sensation or pain when you urinate or a need to go to the bathroom often   Green or yellow drainage from your nose, or a tendency to cough up green or yellow phlegm.   Toothaches, bleeding gums, or sores in the mouth that do not go away.   Vomiting or diarrhea that last more than an hour or so.   Any symptom that worries you    If you have a fever, you need to increase the amount of fluids you drink. Fluids help  you flush infection out of your body.  They also help keep you from getting dehydrated (dried out), which is bad for you and the baby. If you have an infection it is also best to get more rest so that your body has the energy to fight the germs.   Antibiotics (medicines that treat infections) are safe during pregnancy and may be necessary to protect your baby from any possible harm from your infection.  Your Health Care Provider will order medicine if you need it.     Remember--you and your Health Care Provider are a team, working to help you have a safe pregnancy and a healthy baby.  Be sure to let your Health Care Provider know of anything that concerns you.  Ignoring or worrying about a problem wont help it to go away! Getting help  right away can make a big difference in your health and your babys safety.

## 2015-11-07 ENCOUNTER — Other Ambulatory Visit
Admission: RE | Admit: 2015-11-07 | Discharge: 2015-11-07 | Disposition: A | Discharge status: Home / Self Care | Payer: Commercial Managed Care - PPO | Source: Ambulatory Visit | Attending: Obstetrics and Gynecology | Admitting: Obstetrics and Gynecology

## 2015-11-07 DIAGNOSIS — Z3492 Encounter for supervision of normal pregnancy, unspecified, second trimester: Secondary | ICD-10-CM

## 2015-11-07 LAB — MCHC: MCHC: 33 g/dL (ref 32–36)

## 2015-11-07 LAB — GLUCOSE TOLERANCE, 1 HOUR: Glucose,50gm 1HR: 118 mg/dL (ref 63–135)

## 2015-11-07 LAB — HEMATOCRIT: Hematocrit: 29 % — ABNORMAL LOW (ref 34–45)

## 2015-11-21 ENCOUNTER — Ambulatory Visit: Payer: Commercial Managed Care - PPO | Attending: Obstetrics and Gynecology | Admitting: Obstetrics and Gynecology

## 2015-11-21 ENCOUNTER — Encounter: Payer: Self-pay | Admitting: Obstetrics and Gynecology

## 2015-11-21 VITALS — BP 108/66 | Ht 62.99 in | Wt 145.2 lb

## 2015-11-21 DIAGNOSIS — Z3480 Encounter for supervision of other normal pregnancy, unspecified trimester: Secondary | ICD-10-CM | POA: Insufficient documentation

## 2015-11-21 DIAGNOSIS — D649 Anemia, unspecified: Secondary | ICD-10-CM

## 2015-11-21 DIAGNOSIS — Z3A28 28 weeks gestation of pregnancy: Secondary | ICD-10-CM | POA: Insufficient documentation

## 2015-11-21 MED ORDER — FERROUS SULFATE 325 (65 FE) MG PO TABS *WRAPPED* *I*
325.0000 mg | ORAL_TABLET | Freq: Two times a day (BID) | ORAL | 2 refills | Status: AC
Start: 2015-11-21 — End: ?

## 2015-11-21 NOTE — Progress Notes (Signed)
Subjective:      Carrie Velazquez is a 25 y.o.G2P1001 female being seen today for her obstetrical visit at [redacted]w[redacted]d.  Patient reports no bleeding, no contractions and no leaking. Fetal movement: normal. She reports pain in her lower back since  y.o.G2P1001 at [redacted]w[redacted]d who presents for routine OB visit.    ICD-10-CM ICD-9-CM   1. Prenatal care, subsequent pregnancy, unspecified trimester Z34.80 V22.1   2. [redacted] weeks gestation of pregnancy Z3A.28 V22.2   3. Anemia, unspecified type D64.9 285.9       Plan:      Patient Active Problem List    Diagnosis Date Noted    Psoriasis 10/16/2010     Priority: Low     Class: Chronic    Anemia 11/21/2015     Hct 29; iron prescribed      LGA (large for gestational age) fetus affecting mother, antepartum 10/01/2015     Noted on f/u anatomic, rev with pt next visit      Normal pregnancy 08/29/2015    Prenatal care 07/06/2015     CLINIC SITE: COB  Varicella status: childhood history  Dental care: Has not had a cleaning in a long time, resistant to dental care.     EDC determined by                            Immunizations      Flu vaccine:  declines   TDaP vaccine: (Last received in the hospital 2012)    Genetic Screening        Declined    Baseline / first trimester      Blood type:  07/06/15 -- A+   Rubella status: 07/06/15 -- Immune   Hep B, syphilis, HIV screens:  Negative x3 on 07/06/15 (reviewed with patient)   Urine culture:   08/02/15 negative    UCDS:   07/06/15 negative   GC/CT DNA amplifications: negative 07/06/15   Cystic fibrosis mutation screening:  done in 2011, CF carrier (reports that her husband is CF negative)   Hemoglobin electrophoresis: Normal 2011    Second trimester      Anatomic ultrasound: completed 10/01/15   Repeat CBC:  Ordered 10/24/2015.          Lab results: 11/07/15  1446   Hematocrit 29*       1-hour glucose tolerance test:  Ordered  10/24/2015.          Lab results: 11/07/15  1446   Glucose,50gm 1HR 118         Third trimester / L&D planning     []  GBS:     No results for input(s): GBS, GBSS in the last 8760 hours.  [x]  Infant gender: female, "Carrie Velazquez"  [x]  Circumcision: desires  [x]  Feeding plan: breast feeding (she breast fed her daughter for three months)  []  Postpartum contraception: Lengthy discussion at 11/21/2015 visit regarding contraceptive options.  She is considering BTL.  Discussed PPBTl, implications  [x]  Pediatrician: She is transitioning pediatricians.      Thyroid nodule 03/06/2011     ? Thyroid nodule on R side of thyroid, referred to PCP and labs done, 03/06/2011   Needs re check in pregnancy, check with next blood draw        Cystic fibrosis carrier      FOB tested in previous pregnancy & is negative       Back pain:  Urine culture sent  Reviewed ways to relieve low back pain (support belt, stretches etc)  Reviewed s/sx of PTL      Dispo:  She will follow-up in 3 weeks.      Efrain Clauson A Harcleroad, NP  11/21/2015  1:12 PM

## 2015-11-21 NOTE — Patient Instructions (Signed)
Common Discomforts: THIRD TRIMESTER    The last few weeks of your pregnancy are often the most uncomfortable.  Your growing baby is taking up more and more space.   Carrying around all that extra weight can make you more tired than usual.  As the baby and your uterus press up, down and out, other discomforts arise.     What are the most common discomforts during this last part of my pregnancy?  Most women have some or all of the following discomforts: edema (swelling), insomnia (unable to get to sleep or stay asleep), uncomfortable intercourse (sex), going to the bathroom a lot, shortness of breath and numbness or tingling in the fingers.   Discomforts you developed in your second trimester, such as leg cramps, constipation or hemorrhoids, may continue, get better or get worse.  Every woman is different and every pregnancy is different.     What can I do to prevent or relieve any discomforts?  No matter how careful you are, it is almost impossible to avoid all discomforts during your pregnancy.  Face it--carrying around a 6-10 pound baby and all that goes with it (placenta, uterus, bag of waters) in the tight space between your rib cage and your hips is bound to be a bit of a hassle!  There are many things you can do to make these last weeks easier on yourself.  This guide looks at each of most common discomforts.     Edema  Edema is swelling of any part of your body.  Your feet, ankles and hands most commonly swell during late pregnancy.  Sometimes swelling can be a sign of a problem.  (See the guide on Warning Signs-Second Trimester).  Sudden swelling, especially in your face or upper body, can be a warning sign.  Call your health care provider (HCP) right away if you have swelling in your face or above your waist.     Swelling also happens because your hormones make your blood vessels leak.  Also, the pressure of your baby on your hips sometimes keeps your blood from flowing well in your legs.  That is why your  feet and ankles may swell.  Eating normal amounts of salt on your food will not cause this, but taking salt pills or eating a lot of salt might make edema worse.    Here are some things to do that can help:   • Try not to wear tight clothing.  Tight waistbands or knee-high and thigh-high socks are especially a problem.  • Take rest periods often. Raise your legs higher than your heart if possible.  At the very least, put your feet up so they are level with your hips.  • Use support panty hose to help the blood flow in your legs  • Watch the amount of salt and salty foods in your diet.   • Call your HCP if the swelling keeps getting worse or happens very quickly.     Insomnia    Insomnia is not being able to sleep.  Sometimes people have trouble falling asleep.  Other people fall asleep, but wake up in the middle of the night and cannot get back to sleep.  Most women have changes in their sleep patterns during pregnancy.  Sometime the size of the baby makes it hard to find a comfortable position.   Pressure on your bladder may make you have to get up to go the bathroom at night.  Stress worrying about things   and being anxious can also keep you from sleeping well.    Here are some things that might help you sleep better:  • Avoid exciting activities before bedtime.   No relay races or scary movies!  • Take a warm bath.  • Try the side-lying position for rest and relaxation.  Some women prefer to support the upper leg with pillows.  • Read a dull book.  • Have someone give you a back rub or massage!  • Exercise before dinner or at least 3 hours before bedtime.   • Take a short nap during the day so you are not overly tired.  • Try not to drink things with caffeine, like coffee, tea, cola or other soda (read the labels).  • Sip on a glass of milk before bed or have a small bowl of cereal with milk.   • Keep a pad by your bed and write down things that are bothering you.  Some women cannot get to sleep for fear they will  forget what they are thinking about.     If there are things that are worrying you, talk to someone about your worries.  Your HCP might be able to give you some other ideas too!    Discomforts during sex    During this last trimester, it can be more challenging to enjoy “being with” your special someone.  Lots of things make a difference in how much you enjoy sex.   Pressure from the growing baby and changes in your vagina make sex feel different.  Your larger belly can get in the way! How you feel and think can change your feelings about sex too.  It is okay to have sex, usually all the way up until your baby’s birth.   You will not hurt the baby!    In some special cases, your HCP may tell you not to have sex or even sex play especially playing with nipples (this can lead to preterm labor).  Except in these special cases, though, you can enjoy sex as much as you and your partner like.        Here are some things that can help make you more comfortable:  • Try different positions.  Lying on your side of often helps.  • Use a water soluble vagina gel, like K-Y Jelly, to keep things slippery.   Do not use petroleum jelly or mineral oil.  Look for water soluble on the package.  • Talk about any fears or concerns you have with your partner or your HCP.   • Tell your HCP if you have any symptoms of a vaginal infection (itching, burning, pain with sex, strange discharge).  Be sure to follow all instructions for treatment.   • Find other ways to express your feelings.  Try new ways to give your partner pleasure if the sex act itself gets too uncomfortable.  See if there are ways he can make you feel better without sex too.    Frequent urination    You may find yourself having to go to the bathroom a lot more often now.  The baby’s head is pushing your bladder (urine storage area).  Your kidneys are also making more urine these days, because you have more blood flowing through them.    Sometimes you develop a urinary  infection.   If it happens, you will also find yourself    urinating more often.  But you may also notice burning   when you go and your urine may be cloudy or smell different than usual.  If any of these things happen, tell your HCP.  There are medicines that can help get rid of the infection and make you feel better.     Things you can do to be more comfortable are:  • Drink fluids often.  Do not cut back on fluids.  Do not drink all your liquids for 1 day at the same time.  Try carrying a large cup of water with you and sip on it often.   • Do not drink liquids with caffeine in them.  Too much caffeine may not be good for the baby.  Caffeine also makes you have to urinate more often.  Coffee and tea have caffeine--even the decaffeinated kind has a little.   Colas and other dark sodas often have caffeine.  Even some “clear” sodas have caffeine, so read the labels to see if caffeine is listed in the ingredients.  • Do Kegel exercises.   Squeeze the muscles in your bottom, then release.   Try to do 10 squeezes every time you think of it.   This will make it easier for you to hold your urine until you can get to a bathroom.    Shortness of breath    A feeling of having a hard time catching your breath occurs in this last trimester.  This is caused by the baby getting bigger and pushing up on your ribs and lungs.  This makes it hard for your lungs to stretch and let you take a deep breath.  Sometimes other problems can make this feeling worse.   If you have a cold with a fever, or if you have had other problems with your heart and lungs, tell your HCP if you get short of breath.  Otherwise here are some ways to breath easier:  • Do not do anything that you find makes you short of breath!  Try not to bend over for long periods.  Pace your exercise and walking so you do not have trouble catching your breath.  Take stairs more slowly.  • Do not wear clothing that is too tight.  Make sure you get larger bras and blouses as  you chest gets larger.   • If lying down makes it harder to breathe, add extra pillows under your head and back at night.   • Split up your activities to include more rest breaks.     Numbness or tingling in the fingers    Some women have strange feelings of numbness or tingling in their fingers toward the end of their pregnancy.  This can come from a change in your posture that puts pressure on the nerves to your hand and arms.  It is more common at night and in the early morning. Moving around or stretching often makes it go away.      It is not normal to have pain, loss of sensation where you cannot feel anything, or numbness that affects your whole hand or wrist.  Let your HCP know if this happens.   If the numbness happens along with swelling, especially first thing in the morning, call your HCP right away.     For normal numbness or tingling, some things that might help include the following:    • Try to keep your back straight.  Do not slouch.   • Raise your hand or hands over your head for a few   minutes when numbness or tingling occurs.   • Do stretching and relaxation exercises for your shoulders, arms and hands.  • Sleep with your hands and forearms up on a pillow.    • If you use a computer a lot, use a wrist pad when typing.   • Ask your HCP about using a wrist splint if the problem does not get better.     Most women have some amount of discomfort as their pregnancy reaches the final weeks.   Talk to your HCP about any discomforts or concerns you have.  Remember--you are now only a few short weeks away from having that baby in your arms.     Reviewed 03/2010

## 2015-11-22 LAB — AEROBIC CULTURE: Aerobic Culture: 0

## 2015-12-18 ENCOUNTER — Encounter: Payer: Self-pay | Admitting: Obstetrics & Gynecology

## 2015-12-18 ENCOUNTER — Ambulatory Visit: Payer: Commercial Managed Care - PPO | Attending: Obstetrics and Gynecology | Admitting: Obstetrics & Gynecology

## 2015-12-18 VITALS — BP 100/59 | Ht 62.99 in | Wt 156.0 lb

## 2015-12-18 DIAGNOSIS — Z3490 Encounter for supervision of normal pregnancy, unspecified, unspecified trimester: Secondary | ICD-10-CM

## 2015-12-18 DIAGNOSIS — M7918 Myalgia, other site: Secondary | ICD-10-CM

## 2015-12-18 DIAGNOSIS — E041 Nontoxic single thyroid nodule: Secondary | ICD-10-CM

## 2015-12-18 DIAGNOSIS — O3660X0 Maternal care for excessive fetal growth, unspecified trimester, not applicable or unspecified: Secondary | ICD-10-CM

## 2015-12-18 DIAGNOSIS — O99891 Other specified diseases and conditions complicating pregnancy: Secondary | ICD-10-CM | POA: Insufficient documentation

## 2015-12-18 DIAGNOSIS — Z3A32 32 weeks gestation of pregnancy: Secondary | ICD-10-CM

## 2015-12-18 NOTE — Patient Instructions (Signed)
Common Discomforts in the Third Trimester  What are the most common discomforts during this last part of my pregnancy?  Most women have some or all of the following things: edema (swelling), insomnia (unable to get to sleep or stay asleep), uncomfortable intercourse (sex), going to the bathroom a lot, shortness of breath and numbness or tingling in the fingers.   Discomforts you developed in your second trimester, such as leg cramps, constipation or hemorrhoids, may continue, get better or get worse.     What can I do to prevent or relieve any discomforts?  It is almost impossible to avoid all discomforts during your pregnancy.  There are many things you can do to make these last weeks easier on yourself.     Swelling  Swelling, also called edema, is common in pregnancy.  Your feet, ankles and hands most commonly swell during late pregnancy.  Notify your health care provider if you suddenly have swelling above your waist.  Here are some things to do that can help:   • Try not to wear tight clothing.  Tight waistbands or knee-high and thigh-high socks are especially a problem.  • Take rest periods often. Raise your legs so they are level with your hips.  • Use support panty hose to help the blood flow in your legs.  Your doctor can give you a prescription for these.  • Be aware of the amount of salt and salty foods in your diet.  Normal amounts of salt do not cause swelling, but excessive salt intake can make swelling worse.    Difficulty Sleeping  Pregnancy can make it difficult to fall asleep and stay asleep.  Here are some things that might help you sleep better:  • Avoid extra activities before bedtime.  • Take a warm bath.  • Try the side-lying position for rest and relaxation  • Have someone give you a back rub or massage.  • Exercise before dinner or at least 3 hours before bedtime.   • Take a short nap during the day so you are not overly tired.  • Try not to drink things with caffeine, like coffee, tea, cola  or other soda (read the labels), especially in the afternoon.  • Sip on a glass of milk before bed or have a small bowl of cereal with milk.   • Keep a pad by your bed and write down things that are bothering you.  Some women cannot get to sleep for fear they will forget what they are thinking about.     Discomfort During Intercourse  During this last trimester, it can be more challenging to enjoy intercourse with your partner.  It is okay to have sex, usually all the way up until your baby’s birth.  Your health care provider will talk to you about any reasons to avoid intercourse.  Here are some things that can help make you more comfortable:  • Try different positions.  Lying on your side of often helps.  • Use a water soluble vagina gel, like K-Y Jelly.   Do not use petroleum jelly or mineral oil.  Look for water soluble on the package.  • Talk about any fears or concerns you have with your partner or your HCP.     Frequent Urination  You may find yourself having to go to the bathroom a lot more often now as the baby's head is pushing on your bladder.  Some things you can do to be more   comfortable are:  • Drink fluids often, but in small quantities.  Avoid drinking all your liquids for the day at the same time.    • Do not drink liquids with caffeine in them, as caffeine can make you have to urinate more often.    Shortness of Breath  A feeling of having a hard time catching your breath occurs in this last trimester.  This is caused by the baby getting bigger and pushing up on your ribs and lungs.  Here are some ways to breath easier:  • Do not do anything that you find makes you short of breath.  Try to avoid bending over for long periods.  Pace your exercise and walking so you do not have trouble catching your breath.  Take stairs more slowly.  • Do not wear clothing that is too tight.  Make sure you get larger bras and blouses as you chest gets larger.   • If lying down makes it harder to breathe, add extra  pillows under your head and back at night.   • Split up your activities to include more rest breaks.     Numbness or Tingling Fingers  Some women have strange feelings of numbness or tingling in their fingers toward the end of their pregnancy.  This can come from a change in your posture that puts pressure on the nerves to your hand and arms.  You should talk to your doctor if you have pain, loss of sensation where you cannot feel anything, or numbness that affects your whole hand or wrist.  For normal numbness or tingling, some things that might help include the following:  • Try to keep your back straight.  Do not slouch.   • Raise your hand or hands over your head for a few minutes when numbness or tingling occurs.   • Do stretching and relaxation exercises for your shoulders, arms and hands.  • Sleep with your hands and forearms up on a pillow.    • If you use a computer a lot, use a wrist pad when typing.   • Ask your HCP about using a wrist splint if the problem does not get better.

## 2015-12-18 NOTE — Progress Notes (Signed)
OB Check at [redacted]w[redacted]d    Subjective:   Carrie Velazquez is a 25 y.o. G2P1001 female at [redacted]w[redacted]d who presents today for routine OB check.  She is bothered by pubic symphysis pain.     Pregnancy Risks:  LGA  USN 4/24: EFW 95% (491g)  Pelvis prove to 3561g  CF carrier  FOB Neg    Pregnancy ROS:  Contractions: Denies  LOF: Denies  VB: Denies  Fetal movement: reports good FM    Peripartum management:  Anticipated gender: female, desires circ  Breastfeeding: breast  ppBC: BTL - DSS signed today     Objective:    Vitals:    12/18/15 1245   BP: 100/59   Weight: 70.8 kg (156 lb)   Height: 1.6 m (5' 2.99")     Body mass index is 27.64 kg/(m^2).    Gen: Healthy appearing pregnant female in NAD  Cardiopulmonary: Normal work of breathing  Abd: Gravid non-tender    BP: 100/59  Weight: 70.8 kg (156 lb) Body mass index is 27.64 kg/(m^2).   Fundal Height (cm): 32 cm  Fetal Heart Rate: 140     Assessment:   25 y.o. G2P1001 at [redacted]w[redacted]d by LMP and USN, with pregnancy complicated by risks noted below.    Plan:      Patient Active Problem List    Diagnosis Date Noted    Psoriasis 10/16/2010     Priority: Low     Class: Chronic    Pain in symphysis pubis during pregnancy 12/18/2015     Advised pt to use Tylenol and try stretches   Pt has tried maternity belts  Offered walker on 12/18/2015 - pt declined      Anemia 11/21/2015     Hct 29; iron prescribed      LGA (large for gestational age) fetus affecting mother, antepartum 10/01/2015     USN 4/24: EFW 95% (491g)  Pelvis proven to 3561g      Prenatal care 07/06/2015     CLINIC SITE: [facility]  Varicella status: childhood history  Dental care: Has not had a cleaning in a long time, resistant to dental care.     EDC determined by LMP c/w early USN                         Immunizations      Flu vaccine: declines   TDaP vaccine: (Last received in the hospital 2012) - Declined on 12/18/15    Genetic Screening        Declined    Baseline / first trimester      Blood type:  07/06/15 -- A+   Rubella  status: 07/06/15 -- Immune   Hep B, syphilis, HIV screens:  Negative x3 on 07/06/15 (reviewed with patient)   Urine culture:   08/02/15 negative    UCDS:   07/06/15 negative   GC/CT DNA amplifications: negative 07/06/15   Cystic fibrosis mutation screening:  done in 2011, CF carrier (reports that her husband is CF negative)   Hemoglobin electrophoresis: Normal 2011    Second trimester      Anatomic ultrasound: completed 10/01/15   Repeat CBC:          Lab results: 11/07/15  1446   Hematocrit 29*       1-hour glucose tolerance test:  Ordered 10/24/2015.          Lab results: 11/07/15  1446   Glucose,50gm 1HR 118  Third trimester / L&D planning     []  GBS:     No results for input(s): GBS, GBSS in the last 8760 hours.  [x]  Infant gender: female, "[name]"  [x]  Circumcision: desires  [x]  Feeding plan: breast feeding (she breast fed her daughter for three months)  [x]  Postpartum contraception: BTL - DSS signed on 12/18/2015   [x]  Pediatrician: She is transitioning pediatricians.      Thyroid nodule 03/06/2011     Thyroid nodule on R side of thyroid, referred to PCP and labs done, 03/06/2011           Cystic fibrosis carrier      FOB tested in previous pregnancy & is negative         Return to clinic in 2 weeks.  Discussed with Dr. Gaetana MichaelisPrevite    Luisdaniel Kenton, MD  Ob/Gyn, PGY-3  Pager - [phone]

## 2016-01-01 ENCOUNTER — Ambulatory Visit: Payer: Commercial Managed Care - PPO | Attending: Obstetrics and Gynecology | Admitting: Obstetrics and Gynecology

## 2016-01-01 ENCOUNTER — Encounter: Payer: Self-pay | Admitting: Obstetrics and Gynecology

## 2016-01-01 VITALS — BP 116/78 | Ht 62.99 in | Wt 160.0 lb

## 2016-01-01 DIAGNOSIS — Z3483 Encounter for supervision of other normal pregnancy, third trimester: Secondary | ICD-10-CM

## 2016-01-01 DIAGNOSIS — Z3A34 34 weeks gestation of pregnancy: Secondary | ICD-10-CM

## 2016-01-01 NOTE — Progress Notes (Signed)
Subjective:      Carrie Velazquez is a 25 y.o.G2P1001 female being seen today for her obstetrical visit at [redacted]w[redacted]d.  Patient reports no bleeding and no leaking. She reports that she has been having contractions for the past 30-45 minutes. Fetal movement: normal.     Patient's medications, allergies, past medical, surgical, social and family histories were reviewed and updated as appropriate.    Pregnancy Risks:  Patient Active Problem List   Diagnosis Code    Cystic fibrosis carrier Z14.1    Psoriasis L40.9    Thyroid nodule E04.1    Prenatal care Z34.90    LGA (large for gestational age) fetus affecting mother, antepartum O36.60X0    Anemia D64.9    Pain in symphysis pubis during pregnancy O99.89, M79.1         Review of Systems  Pertinent items are noted in HPI.     Objective:   BP: 116/78  Weight: 72.6 kg (160 lb) Body mass index is 28.35 kg/(m^2).   Total weight gain: Not found.  Fundal Height (cm): 35 cm  Fetal Heart Rate: 140       SVE: Closed      Assessment:     24 y.o.G2P1001 at [redacted]w[redacted]d who presents for routine OB visit.    ICD-10-CM ICD-9-CM   1. Prenatal care, subsequent pregnancy, third trimester Z34.83 V22.1   2. [redacted] weeks gestation of pregnancy Z3A.34 V22.2         Plan:         28-week labs reviewed, normal  Rhogam N/A  Postpartum birth control plan: choices discussed  Pediatrician: selected.  Infant feeding: plans to breastfeed.  Contractions:   Advised to rest and remain hydrated   Advised to call if contractions persist or become closer together    Dispo:  She will follow-up in 1-2 weeks.      Eydan Chianese A Harcleroad, NP  01/01/2016  3:40 PM

## 2016-01-01 NOTE — Patient Instructions (Signed)
How to Tell When Labor Starts    The Basics   Written by the doctors and editors at UpToDate   What is labor? -- Labor is the way a woman’s body prepares to give birth. Labor usually starts on its own between 37 and 42 weeks of pregnancy. A woman’s “due date” is at 40 weeks.   A pregnancy that lasts 37 to 42 weeks is called a “term” pregnancy. When labor starts before 37 weeks, doctors call it “preterm” labor.  What are the signs that labor is starting? -- The different signs that labor is starting can include the following:  ?The baby moves lower (or “drops”) in your belly.   ?You have increased vaginal discharge that is thick, mucus-like, or slightly bloody. (Vaginal discharge is the term doctors use to describe the fluid that comes out of the vagina.) The increased vaginal discharge is sometimes called a “mucus plug” or a “bloody show.”   ?Your water breaks. During pregnancy, your baby is in a sac in your uterus and surrounded by a fluid called “amniotic fluid.” This sac will break open sometime before your baby is born. When it breaks open, the fluid inside comes out of your vagina. This can feel like a gush or trickle of fluid.  ?You have low back pain or belly cramps.  ?You start having contractions. During a contraction, the uterus tightens. This can be painful and make your belly feel hard. After a contraction, the uterus relaxes and the pain goes away. Some women have “Braxton Hicks contractions” or “false labor contractions.” These feel like contractions, but they are not true contractions. They do not mean that you are in labor.   How can I tell if I’m having true contractions? -- It can be hard to tell if you are having true contractions or Braxton Hicks contractions. But here are some ways to help tell the difference.   ?True contractions come every few minutes and get more frequent over time. Braxton Hicks contractions can come every few minutes, but they don’t get more frequent over time.  ?True  contractions don’t go away, even when you rest. Braxton Hicks contractions usually go away when you rest.  ?True contractions will get stronger and more painful over time. Braxton Hicks contractions usually don’t get stronger or more painful over time.  If you are still not sure whether you are having true contractions, call your doctor or midwife.  What should I do if I start having contractions? -- If you start having contractions, you should time them to see how far apart they are. That way, you can tell if they get more frequent.   You can time your contractions by writing down the time when each contraction starts. If you have a clock with a second hand, you can also time how long each contraction lasts. Your doctor or midwife will want to know how far apart your contractions are and how long they last.  When should I call my doctor or midwife? -- Call your doctor or midwife if you think you are in labor. You should also call if ANY of the following things happen:  ?You have blood, mucus, or fluid leaking from your vagina.  ?You have 6 or more contractions in 1 hour. (That means your contractions are 10 minutes apart or less.)  ?Your contractions are getting stronger and are painful.  Your doctor or midwife will probably want to see you to do an exam. To tell if you are in labor, he or she will check your cervix to   see if it is opening (“dilated”) and thinning out. He or she will see how frequent your contractions are. He or she might also do other tests.  What if my labor starts too soon? -- If you start having any symptoms of labor before 37 weeks, call your doctor right away. He or she might want to give you medicine to try to stop your labor.   What if my labor doesn’t start on its own? -- If your labor doesn’t start on its own, your doctor will talk to you about your options. He or she might try to start your labor with medicines. This is called “inducing labor.”  How long will my labor last? -- If it’s  your first baby, your labor will probably last for many hours. If it’s not your first baby, your labor will probably be shorter.  All topics are updated as new evidence becomes available and our peer review process is complete.  This topic retrieved from UpToDate on: Sep 28, 2013.  Topic 16909 Version 4.0  Release: 23.3 - C23.74  © 2015 [employer], [employer]. All rights reserved.  Consumer Information Use and Disclaimer   This information is not specific medical advice and does not replace information you receive from your health care provider. This is only a brief summary of general information. It does NOT include all information about conditions, illnesses, injuries, tests, procedures, treatments, therapies, discharge instructions or life-style choices that may apply to you. You must talk with your health care provider for complete information about your health and treatment options. This information should not be used to decide whether or not to accept your health care provider's advice, instructions or recommendations. Only your health care provider has the knowledge and training to provide advice that is right for you.The use of UpToDate content is governed by the UpToDate Terms of Use. ©2015 [employer], Inc. All rights reserved.  Copyright   © 2015 [employer], Inc. All rights reserved.

## 2016-01-14 ENCOUNTER — Telehealth: Payer: Self-pay | Admitting: Obstetrics and Gynecology

## 2016-01-14 ENCOUNTER — Observation Stay
Admission: AD | Admit: 2016-01-14 | Discharge: 2016-01-14 | Disposition: A | Discharge status: Home / Self Care | Payer: Commercial Managed Care - PPO | Source: Ambulatory Visit | Attending: Obstetrics and Gynecology | Admitting: Obstetrics and Gynecology

## 2016-01-14 DIAGNOSIS — O26899 Other specified pregnancy related conditions, unspecified trimester: Secondary | ICD-10-CM | POA: Diagnosis present

## 2016-01-14 DIAGNOSIS — Z3A36 36 weeks gestation of pregnancy: Secondary | ICD-10-CM | POA: Insufficient documentation

## 2016-01-14 DIAGNOSIS — N898 Other specified noninflammatory disorders of vagina: Secondary | ICD-10-CM | POA: Diagnosis present

## 2016-01-14 DIAGNOSIS — O42913 Preterm premature rupture of membranes, unspecified as to length of time between rupture and onset of labor, third trimester: Principal | ICD-10-CM | POA: Insufficient documentation

## 2016-01-14 NOTE — Discharge Instructions (Signed)
Reason for Triage Visit: Rule out ROM  Destination: Home  Mode: Ambulatory    Procedures done in triage: Fetal monitoring and Sterile speculum exam  Pending Laboratory Data:  GBS    Diet: Regular    Activity: Regular activity    Special Instructions: Call or return to triage if you have worsening of any of your symptoms, if you have more than 6 contractions in an hour, if you have vaginal bleeding or leakage of clear fluid, if you have headache or vision changes, if you have right-sided pain, or if you don't feel the baby moving.      If you cannot reach your OB/Gyn or Midwife, call the Labor and Delivery Unit or 911 if it is an emergency.    Follow-up with your OB provider as previously instructed.

## 2016-01-14 NOTE — OB Triage Note (Signed)
OBSTETRICS TRIAGE NOTE      Chief Complaint: leaking fluid     HPI     Carrie Velazquez is a 25 y.o. G2P1001 at 55w3dby LMP c/w 7w ultrasound with pregnancy complicated by risks outlined below who presents with leakage of clear fluid since yesterday at 8am. She only reports small amounts of fluid that she has been wearing a pad to catch so that her underwear does not get wet. She denies contractions, VB. Baby moving well. Last intercourse was 3 days ago and she did not have leakage immediately after that. She denies dysuria, hematuria, urinary urgency. She denies vaginal discharge.       Pregnancy Risks     LGA   - USN 4/24 with EFW of 95% (491g)   - Pelvis proven to 3561g     CF carrier, FOB negative     Obstetrical History     OB History   Gravida Para Term Preterm AB Living   _0 SAB TAB Ectopic Multiple Live Births             # Outcome Date GA Lbr Len/2nd Weight Sex Delivery Anes PTL Lv   2 Current            1 Term 12/06/10 442w3d6:39 / 01:05 3561 g (7 lb 13.6 oz) F Vag-Spont EPI,Local N LIV            PMH, PSH, SH, GYN History, Allergies, and Current Medications reviewed and updated in eRecord.       Review of Systems     Constitutional: Negative for fever or fatigue.  Psych: Denies depressive symptoms or anxiety.  Cardiovascular: Denies chest pain or palpitations.  Respiratory: Denies shortness of breath or orthopnea.  Gastrointestinal: Denies nausea/vomiting.  No abdominal pain.  No flank pain.  Musculoskeletal: Denies muscle weakness.  Skin/Extremities: No peripheral edema.  Denies new skin rashes or lesions.  Neurologic: No headaches or vision changes.  Denies syncope.  Denies recent falls.  Genitourinary: Denies dysuria.  Denies urinary frequency or urgency.  Denies hematuria.  Denies flank pain.  Denies vaginal bleeding.  Denies leakage of fluid.        Prenatal Labs     No results for input(s): WBC, HGB, HCT, PLT in the last 168 hours.  No results for input(s): NA, K, CL, CO2, UN, CREAT, GFRC,  GLU in the last 168 hours. No results for input(s): LD, URIC, ALT, AST, ALK, TB in the last 168 hours.   No results for input(s): UTPR, UCRR in the last 168 hours.  Urine spot P/C ratio: not indicated         Lab results: 07/06/15  1232   ABO RH Blood Type A RH POS   Rubella IgG AB POSITIVE   Syphilis Screen Neg   HIV 1&2 ANTIGEN/ANTIBODY Nonreactive   HBV S Ag NEG        Lab results: 11/07/15  1446   Glucose,50gm 1HR 118      No results for input(s): GL0, GL1H, GL2H, GLEaglen the last 8760 hours.        Physical Exam     Vitals:    01/14/16 2146 01/14/16 2147   Temp: 36.2 C (97.2 F)    TempSrc: Temporal    Weight:  72.6 kg (160 lb)   Height:  1.6 m (_1 )       General Appearance: healthy, no distress and cooperative  Mental  Status: Alert and oriented x 3  Mood/Affect: Mood  happy  Skin: color normal, vascularity normal and no evidence of bleeding or bruising  HEENT: Normocephalic, atraumatic.  Respiratory: normal WOB, unlabored  Abdomen: Soft, gravid, non-tender.      Uterus: Gravid.  36 weeks size.  Non-tender to palpation.  Neurological: grossly intact  Extremities: normal    External Genitalia: Unremarkable external genitalia without lesions.  Normal appearing labia majora/minora and introitus.  Vagina: Pink, ruggated vaginal mucosa without lesions.  Small amount of white vaginal discharge.  No bleeding.  Cervix: Cervical examination as per below.  No lesions.  No leakage of fluid even with cough.    Cervical Examination: visually closed    Sterile Speculum Exam:  Pooling: negative  Nitrazine: negative  Ferning: negative  Membranes: intact  Wet Prep: no pathogens    Fetal Monitoring:  Baseline: 135 bpm  Variability: Moderate (6-25 BPM)  Accelerations: Yes 15X15  Decelerations: None  Category: I  Toco: occasional contractions >q52mn       Assessment & Plan     NJacquita Mulhearnis a 25y.o. G2P1001 at 340w3dith pregnancy complicated by risks outlined above who presents with leakage of fluid.    1. EFM: Reactive  NST.   Category I fetal heart tracing.  2. Tocometer: irregular, q8m82mor more   3. Cervical exam: visually closed   4. SSE/Wet Prep: negative for rupture; no pathogens seen.  5. Labs/Swabs Collected: GBS.  6. Plan: d/c home with [facility]    D/w Dr. MeyBjorn Loser   KatGolden HurterD, PhD  OB/GYN PGY-2  [id number]/12/2015   10:43 PM

## 2016-01-14 NOTE — Progress Notes (Signed)
Pt d/c'd home ambulatory undelivered. D/c instructions reviewed with pt. Pt verbalizes understanding to call MD with bleeding, ROM, decreased fetal movement, contractions that are more frequent or intense or any other concerns.

## 2016-01-14 NOTE — Telephone Encounter (Signed)
Telephone note:     Carrie Velazquez is a 25 y/o G2P1001 at 5884w3d who called into the answering service to report that she thinks she broke her water. She has noted a slow leak in her underwear since yesterday. It appears to be clear fluid, and she has needed to wear a pad so it doesn't soak through her clothing when she is walking. She denies VB, contractions. Baby moving well. We discussed that she should come in for evaluation to see if her membranes have ruptured. She has a ride and plans to present to triage.     Cleophus MoltKatrina Rasheida Broden, MD, PhD  OB/GYN PGY-2  [phone]  01/14/2016   8:49 PM

## 2016-01-15 ENCOUNTER — Ambulatory Visit: Payer: Commercial Managed Care - PPO

## 2016-01-15 ENCOUNTER — Encounter: Payer: Self-pay | Admitting: Obstetrics and Gynecology

## 2016-01-15 ENCOUNTER — Ambulatory Visit: Payer: Commercial Managed Care - PPO | Attending: Obstetrics and Gynecology | Admitting: Obstetrics and Gynecology

## 2016-01-15 VITALS — BP 115/75 | Ht 62.99 in | Wt 161.0 lb

## 2016-01-15 DIAGNOSIS — Z3483 Encounter for supervision of other normal pregnancy, third trimester: Secondary | ICD-10-CM

## 2016-01-15 DIAGNOSIS — Z3A36 36 weeks gestation of pregnancy: Secondary | ICD-10-CM | POA: Insufficient documentation

## 2016-01-15 NOTE — Patient Instructions (Signed)
Common Discomforts: THIRD TRIMESTER    The last few weeks of your pregnancy are often the most uncomfortable.  Your growing baby is taking up more and more space.   Carrying around all that extra weight can make you more tired than usual.  As the baby and your uterus press up, down and out, other discomforts arise.     What are the most common discomforts during this last part of my pregnancy?  Most women have some or all of the following discomforts: edema (swelling), insomnia (unable to get to sleep or stay asleep), uncomfortable intercourse (sex), going to the bathroom a lot, shortness of breath and numbness or tingling in the fingers.   Discomforts you developed in your second trimester, such as leg cramps, constipation or hemorrhoids, may continue, get better or get worse.  Every woman is different and every pregnancy is different.     What can I do to prevent or relieve any discomforts?  No matter how careful you are, it is almost impossible to avoid all discomforts during your pregnancy.  Face it--carrying around a 6-10 pound baby and all that goes with it (placenta, uterus, bag of waters) in the tight space between your rib cage and your hips is bound to be a bit of a hassle!  There are many things you can do to make these last weeks easier on yourself.  This guide looks at each of most common discomforts.     Edema  Edema is swelling of any part of your body.  Your feet, ankles and hands most commonly swell during late pregnancy.  Sometimes swelling can be a sign of a problem.  (See the guide on Warning Signs-Second Trimester).  Sudden swelling, especially in your face or upper body, can be a warning sign.  Call your health care provider (HCP) right away if you have swelling in your face or above your waist.     Swelling also happens because your hormones make your blood vessels leak.  Also, the pressure of your baby on your hips sometimes keeps your blood from flowing well in your legs.  That is why your  feet and ankles may swell.  Eating normal amounts of salt on your food will not cause this, but taking salt pills or eating a lot of salt might make edema worse.    Here are some things to do that can help:   • Try not to wear tight clothing.  Tight waistbands or knee-high and thigh-high socks are especially a problem.  • Take rest periods often. Raise your legs higher than your heart if possible.  At the very least, put your feet up so they are level with your hips.  • Use support panty hose to help the blood flow in your legs  • Watch the amount of salt and salty foods in your diet.   • Call your HCP if the swelling keeps getting worse or happens very quickly.     Insomnia    Insomnia is not being able to sleep.  Sometimes people have trouble falling asleep.  Other people fall asleep, but wake up in the middle of the night and cannot get back to sleep.  Most women have changes in their sleep patterns during pregnancy.  Sometime the size of the baby makes it hard to find a comfortable position.   Pressure on your bladder may make you have to get up to go the bathroom at night.  Stress worrying about things   and being anxious can also keep you from sleeping well.    Here are some things that might help you sleep better:  • Avoid exciting activities before bedtime.   No relay races or scary movies!  • Take a warm bath.  • Try the side-lying position for rest and relaxation.  Some women prefer to support the upper leg with pillows.  • Read a dull book.  • Have someone give you a back rub or massage!  • Exercise before dinner or at least 3 hours before bedtime.   • Take a short nap during the day so you are not overly tired.  • Try not to drink things with caffeine, like coffee, tea, cola or other soda (read the labels).  • Sip on a glass of milk before bed or have a small bowl of cereal with milk.   • Keep a pad by your bed and write down things that are bothering you.  Some women cannot get to sleep for fear they will  forget what they are thinking about.     If there are things that are worrying you, talk to someone about your worries.  Your HCP might be able to give you some other ideas too!    Discomforts during sex    During this last trimester, it can be more challenging to enjoy “being with” your special someone.  Lots of things make a difference in how much you enjoy sex.   Pressure from the growing baby and changes in your vagina make sex feel different.  Your larger belly can get in the way! How you feel and think can change your feelings about sex too.  It is okay to have sex, usually all the way up until your baby’s birth.   You will not hurt the baby!    In some special cases, your HCP may tell you not to have sex or even sex play especially playing with nipples (this can lead to preterm labor).  Except in these special cases, though, you can enjoy sex as much as you and your partner like.        Here are some things that can help make you more comfortable:  • Try different positions.  Lying on your side of often helps.  • Use a water soluble vagina gel, like K-Y Jelly, to keep things slippery.   Do not use petroleum jelly or mineral oil.  Look for water soluble on the package.  • Talk about any fears or concerns you have with your partner or your HCP.   • Tell your HCP if you have any symptoms of a vaginal infection (itching, burning, pain with sex, strange discharge).  Be sure to follow all instructions for treatment.   • Find other ways to express your feelings.  Try new ways to give your partner pleasure if the sex act itself gets too uncomfortable.  See if there are ways he can make you feel better without sex too.    Frequent urination    You may find yourself having to go to the bathroom a lot more often now.  The baby’s head is pushing your bladder (urine storage area).  Your kidneys are also making more urine these days, because you have more blood flowing through them.    Sometimes you develop a urinary  infection.   If it happens, you will also find yourself    urinating more often.  But you may also notice burning   when you go and your urine may be cloudy or smell different than usual.  If any of these things happen, tell your HCP.  There are medicines that can help get rid of the infection and make you feel better.     Things you can do to be more comfortable are:  • Drink fluids often.  Do not cut back on fluids.  Do not drink all your liquids for 1 day at the same time.  Try carrying a large cup of water with you and sip on it often.   • Do not drink liquids with caffeine in them.  Too much caffeine may not be good for the baby.  Caffeine also makes you have to urinate more often.  Coffee and tea have caffeine--even the decaffeinated kind has a little.   Colas and other dark sodas often have caffeine.  Even some “clear” sodas have caffeine, so read the labels to see if caffeine is listed in the ingredients.  • Do Kegel exercises.   Squeeze the muscles in your bottom, then release.   Try to do 10 squeezes every time you think of it.   This will make it easier for you to hold your urine until you can get to a bathroom.    Shortness of breath    A feeling of having a hard time catching your breath occurs in this last trimester.  This is caused by the baby getting bigger and pushing up on your ribs and lungs.  This makes it hard for your lungs to stretch and let you take a deep breath.  Sometimes other problems can make this feeling worse.   If you have a cold with a fever, or if you have had other problems with your heart and lungs, tell your HCP if you get short of breath.  Otherwise here are some ways to breath easier:  • Do not do anything that you find makes you short of breath!  Try not to bend over for long periods.  Pace your exercise and walking so you do not have trouble catching your breath.  Take stairs more slowly.  • Do not wear clothing that is too tight.  Make sure you get larger bras and blouses as  you chest gets larger.   • If lying down makes it harder to breathe, add extra pillows under your head and back at night.   • Split up your activities to include more rest breaks.     Numbness or tingling in the fingers    Some women have strange feelings of numbness or tingling in their fingers toward the end of their pregnancy.  This can come from a change in your posture that puts pressure on the nerves to your hand and arms.  It is more common at night and in the early morning. Moving around or stretching often makes it go away.      It is not normal to have pain, loss of sensation where you cannot feel anything, or numbness that affects your whole hand or wrist.  Let your HCP know if this happens.   If the numbness happens along with swelling, especially first thing in the morning, call your HCP right away.     For normal numbness or tingling, some things that might help include the following:    • Try to keep your back straight.  Do not slouch.   • Raise your hand or hands over your head for a few   minutes when numbness or tingling occurs.   • Do stretching and relaxation exercises for your shoulders, arms and hands.  • Sleep with your hands and forearms up on a pillow.    • If you use a computer a lot, use a wrist pad when typing.   • Ask your HCP about using a wrist splint if the problem does not get better.     Most women have some amount of discomfort as their pregnancy reaches the final weeks.   Talk to your HCP about any discomforts or concerns you have.  Remember--you are now only a few short weeks away from having that baby in your arms.     Reviewed 03/2010

## 2016-01-15 NOTE — Progress Notes (Signed)
Subjective:      Carrie Velazquez is a 25 y.o.G2P1001 female being seen today for her obstetrical visit at 9858w4d.  Patient reports no bleeding and occasional contractions. She was seen in triage yesterday as she was concerned about LOF. She reports that she has continued to "feel wetter than usual". Fetal movement: normal.     Patient's medications, allergies, past medical, surgical, social and family histories were reviewed and updated as appropriate.    Pregnancy Risks:  Patient Active Problem List   Diagnosis Code    Cystic fibrosis carrier Z14.1    Psoriasis L40.9    Thyroid nodule E04.1    Prenatal care Z34.90    LGA (large for gestational age) fetus affecting mother, antepartum O36.60X0    Anemia D64.9    Pain in symphysis pubis during pregnancy O99.89, M79.1    Vaginal discharge during pregnancy O26.899, N89.8         Review of Systems  Pertinent items are noted in HPI.     Objective:      BP: 115/75  Weight: 73 kg (161 lb) Body mass index is 28.53 kg/(m^2).   Total weight gain: Not found.  FHT:     Uterine Size:     SVE:     Presentation:        Assessment:     25 y.o.G2P1001 at 6458w4d who presents for routine OB visit.    Plan:   ORDERED TESTING:   No orders of the defined types were placed in this encounter.      Diagnoses include:  1. Prenatal care, subsequent pregnancy, third trimester  OB ultrasound scan   2. [redacted] weeks gestation of pregnancy          Problem list reviewed and updated.  No problems updated.    GBS: Pending  Labor precautions reviewed  Pediatrician: selected.  Infant feeding: plans to breastfeed.  Postpartum birth control plan: choices discussed  Ultrasound ordered to check AFI    Dispo:  She will follow-up in 1 week.      Meera Vasco A Harcleroad, NP  01/15/2016  3:31 PM

## 2016-01-16 LAB — GROUP B STREP CULTURE: Group B Strep Culture: 0

## 2016-01-17 ENCOUNTER — Telehealth: Payer: Self-pay | Admitting: Obstetrics and Gynecology

## 2016-01-17 DIAGNOSIS — O403XX1 Polyhydramnios, third trimester, fetus 1: Secondary | ICD-10-CM

## 2016-01-17 DIAGNOSIS — O3663X1 Maternal care for excessive fetal growth, third trimester, fetus 1: Secondary | ICD-10-CM

## 2016-01-17 DIAGNOSIS — O409XX0 Polyhydramnios, unspecified trimester, not applicable or unspecified: Secondary | ICD-10-CM | POA: Insufficient documentation

## 2016-01-17 NOTE — Telephone Encounter (Signed)
[place]. Relayed Tammy Harcleroad's message to her regarding a follow up U/S. Transferred to the front desk to schedule.

## 2016-01-17 NOTE — Telephone Encounter (Signed)
Reviewed ultrasound done on 01/15/2016. The ultrasound showed polyhydramnios and LGA. Carrie Velazquez needs a repeat ultrasound on 01/22/2016 to repeat AFI. She has an OB appointment on 8/15 as well; please try to schedule her ultrasound the same day. Routed to RN pool to advise patient and assist her in scheduling ultrasound appointment. [name], NP

## 2016-01-22 ENCOUNTER — Ambulatory Visit: Payer: Commercial Managed Care - PPO | Attending: Obstetrics and Gynecology | Admitting: Obstetrics and Gynecology

## 2016-01-22 ENCOUNTER — Encounter: Payer: Self-pay | Admitting: Obstetrics and Gynecology

## 2016-01-22 VITALS — BP 110/70 | Ht 62.99 in | Wt 163.0 lb

## 2016-01-22 DIAGNOSIS — O409XX0 Polyhydramnios, unspecified trimester, not applicable or unspecified: Secondary | ICD-10-CM

## 2016-01-22 DIAGNOSIS — Z3A37 37 weeks gestation of pregnancy: Secondary | ICD-10-CM

## 2016-01-22 DIAGNOSIS — Z3483 Encounter for supervision of other normal pregnancy, third trimester: Secondary | ICD-10-CM

## 2016-01-22 NOTE — Progress Notes (Signed)
Subjective:      Artis Delayikki Bartle is a 25 y.o.G2P1001 female being seen today for her obstetrical visit at 947w4d.  Patient reports being exhausted and uncomfortable. Fetal movement: normal.     Patient's medications, allergies, past medical, surgical, social and family histories were reviewed and updated as appropriate.    Pregnancy Risks:  Anemia  Polyhydramnios  CF carrier, FOB negative  LGA infant - 92%tile on 8/8 USN with AC >97th %tile and HC:AC 0.96      Review of Systems  Pertinent items are noted in HPI.     Objective:        Visit Vitals    BP 110/70    Ht 1.6 m (5' 2.99")    Wt 73.9 kg (163 lb)    LMP 05/04/2015 (Exact Date)    BMI 28.88 kg/m2     FHT:  145 BPM   Uterine Size: 39 cm   SVE:  deferred   Presentation: cephalic      Assessment:     25 y.o.G2P1001 at 3347w4d who presents for routine OB visit.    Plan:       CLINIC SITE: COB  Varicella status: childhood history  Dental care: Has not had a cleaning in a long time, resistant to dental care.     EDC determined by LMP c/w early USN                         Immunizations     [x]  Flu vaccine: declines  []  TDaP vaccine: (Last received in the hospital 2012) - Declined on 12/18/15    Genetic Screening     [x]    Declined    Baseline / first trimester     [x]  Blood type:  07/06/15 -- A+  [x]  Rubella status: 07/06/15 -- Immune  [x]  Hep B, syphilis, HIV screens:  Negative x3 on 07/06/15 (reviewed with patient)  [x]  Urine culture:   08/02/15 negative   [x]  UCDS:   07/06/15 negative  [x]  GC/CT DNA amplifications: negative 07/06/15  [x]  Cystic fibrosis mutation screening:  done in 2011, CF carrier (reports that her husband is CF negative)  [x]  Hemoglobin electrophoresis: Normal 2011    Second trimester     [x]  Anatomic ultrasound: completed 10/01/15  [x]  Repeat CBC:          Lab results: 11/07/15  1446   Hematocrit 29*      [x]  1-hour glucose tolerance test:  Ordered 10/24/2015.          Lab results: 11/07/15  1446   Glucose,50gm 1HR 118         Third trimester / L&D  planning     [x]  GBS:     neg      Lab results: 01/14/16  2237   Group B Strep Culture .     [x]  Infant gender: female, "[name]"  [x]  Circumcision: desires  [x]  Feeding plan: breast feeding (she breast fed her daughter for three months)  [x]  Postpartum contraception: BTL - DSS signed on 01/22/2016   [x]  Pediatrician: She is transitioning pediatricians.      Polyhydramnios  8/8 U/S 23.68 cm, MVP8.4cm   <> repeat AFI in 01/23/2016  <> weekly NST until delivery  <> discussed IOL between 39 and 40 weeks depending on favorability of cervix and continued normal fetal testing.      Follow up in 1 Week.     Luretha RuedEmily Emad Brechtel, MD

## 2016-01-23 ENCOUNTER — Ambulatory Visit: Payer: Commercial Managed Care - PPO

## 2016-01-23 VITALS — BP 98/60 | HR 88

## 2016-01-23 DIAGNOSIS — Z3A38 38 weeks gestation of pregnancy: Secondary | ICD-10-CM

## 2016-01-23 DIAGNOSIS — Z3483 Encounter for supervision of other normal pregnancy, third trimester: Secondary | ICD-10-CM

## 2016-01-23 DIAGNOSIS — O403XX1 Polyhydramnios, third trimester, fetus 1: Secondary | ICD-10-CM

## 2016-01-23 DIAGNOSIS — O3663X1 Maternal care for excessive fetal growth, third trimester, fetus 1: Secondary | ICD-10-CM

## 2016-01-23 NOTE — Procedures (Addendum)
Fetal Non-Stress Test  Patient: Carrie Velazquez  Age: 25 y.o.  Date of Birth: April 10, 1991  AVW:[id number]:female  MRN: [id number]  GA: 435w5d    Reason for exam:   Polyhydramnios,LGA    Maternal   Heart Rate: 88 (01/23/16 1326)  BP: 98/60 (01/23/16 1326)    Education Done:   External fetal monitoring: yes   Fetal kick counts: yes   NST: yes   VAS: yes   Manual stimulation:yes    Other     Fetal non-stress test for singleton pregnancy  Pre Procedure Diagnosis: LGA (large for gestational age) fetus affecting management of mother  Post Procedure Diagnosis: NST (non-stress test) reactive            NST Start Time:  01/23/2016 1:31 PM            Uterine Irritability: No            Contractions:     NST Fetus A  Variability: moderate  FHR Baseline: 155  Decelerations: variable  Accelerations: at least 15 BPM for 15 seconds  Stimulation: manual and acoustic  NST Interpretation: reactive    End Time:  01/23/2016 2:04 PM          Duration of test (min): 33  Location of NST Fetal Heart Tracing: Archived electronically in CPN  Reviewed with:  Jeremy JohannGlantz   Interpreting Provider Recommendations:  Other (comment)  Comments:  Patient notes occassional "back discomfort" that she believes is a contraction.  However, abdomen does not palpate or trace as a contraction at time noted by patient.  Two areas of shallow "decels" noted, but unable to define as "late or early" in absence of contraction.  USN changed to BPP to follow, per review with Dr. Jeremy JohannGlantz     MFM  Reactive NST, with two subtle decelerations without definite contractions to allow timing.  BPP 8/10.  Rhetta MuraJ CHRISTOPHER Saliha Salts, MD      Test performed By: Lyndee HensenJudi A Coms, RN  01/23/2016 1:39 PM

## 2016-01-29 ENCOUNTER — Encounter: Payer: Self-pay | Admitting: Obstetrics and Gynecology

## 2016-01-29 ENCOUNTER — Ambulatory Visit: Payer: Commercial Managed Care - PPO | Attending: Obstetrics and Gynecology | Admitting: Obstetrics and Gynecology

## 2016-01-29 VITALS — BP 105/58 | Ht 62.99 in | Wt 163.0 lb

## 2016-01-29 DIAGNOSIS — O409XX0 Polyhydramnios, unspecified trimester, not applicable or unspecified: Secondary | ICD-10-CM

## 2016-01-29 DIAGNOSIS — Z3A38 38 weeks gestation of pregnancy: Secondary | ICD-10-CM

## 2016-01-29 DIAGNOSIS — Z3483 Encounter for supervision of other normal pregnancy, third trimester: Secondary | ICD-10-CM

## 2016-01-29 NOTE — Progress Notes (Signed)
Subjective:      Carrie Velazquez is a 25 y.o.G2P1001 female being seen today for her obstetrical visit at 2231w4d.  Patient reports fatigue, contractions. Fetal movement: normal.     Patient's medications, allergies, past medical, surgical, social and family histories were reviewed and updated as appropriate.    Pregnancy Risks:  Polyhydramnios - AFI 27  Anemia  CF carrier  LGA infant - EFW 92%tile    Review of Systems  Pertinent items are noted in HPI.     Objective:      BP 105/58   Ht 1.6 m (5' 2.99")   Wt 73.9 kg (163 lb)   LMP 05/04/2015 (Exact Date)   BMI 28.88 kg/m2  FHT:  145 BPM   Uterine Size: size equals dates   SVE:  FT/50/-2   Presentation: cephalic      Assessment:     25 y.o.G2P1001 at 4631w4d who presents for routine OB visit.    Plan:     Patient Active Problem List    Diagnosis Date Noted    Polyhydramnios 01/17/2016     Priority: High     8/8 U/S 23.68 cm  01/23/16 27cm    <> weekly NST until delivery  <> discussed IOL at 39 weeks.  Patient would like to move forward with IOL      LGA (large for gestational age) fetus affecting mother, antepartum 10/01/2015     Priority: High     Pelvis proven to 3561g  U/S 8/8: EFW 92% (3430g); polyhydramnios  Discussed risks of shoulder dystocia      Prenatal care 07/06/2015     Priority: High     CLINIC SITE: COB  Varicella status: childhood history  Dental care: Has not had a cleaning in a long time, resistant to dental care.     EDC determined by LMP c/w early USN                         Immunizations     [x]  Flu vaccine: declines  []  TDaP vaccine: (Last received in the hospital 2012) - Declined on 12/18/15    Genetic Screening     [x]    Declined    Baseline / first trimester     [x]  Blood type:  07/06/15 -- A+  [x]  Rubella status: 07/06/15 -- Immune  [x]  Hep B, syphilis, HIV screens:  Negative x3 on 07/06/15 (reviewed with patient)  [x]  Urine culture:   08/02/15 negative   [x]  UCDS:   07/06/15 negative  [x]  GC/CT DNA amplifications: negative 07/06/15  [x]  Cystic  fibrosis mutation screening:  done in 2011, CF carrier (reports that her husband is CF negative)  [x]  Hemoglobin electrophoresis: Normal 2011    Second trimester     [x]  Anatomic ultrasound: completed 10/01/15  [x]  Repeat CBC:          Lab results: 11/07/15  1446   Hematocrit 29*      [x]  1-hour glucose tolerance test:  Ordered 10/24/2015.          Lab results: 11/07/15  1446   Glucose,50gm 1HR 118         Third trimester / L&D planning     [x]  GBS:     neg      Lab results: 01/14/16  2237   Group B Strep Culture .     [x]  Infant gender: female, "[name]"  [x]  Circumcision: desires  [x]  Feeding plan: breast feeding (she breast  fed her daughter for three months)  [x]  Postpartum contraception: BTL - DSS signed on 01/22/2016   [x]  Pediatrician: She is transitioning pediatricians.      Polyhydramnios  8/8 U/S 23.68 cm, MVP8.4cm.  01/23/16 AFI 27   <> weekly NST until delivery  <> discussed IOL at 39 weeks  <> induction form submitted 01/29/16 for 8/25       RTC for PP visit    Luretha RuedEmily Drusilla Wampole, MD

## 2016-01-30 ENCOUNTER — Ambulatory Visit: Payer: Commercial Managed Care - PPO | Attending: Obstetrics and Gynecology

## 2016-01-30 DIAGNOSIS — O403XX1 Polyhydramnios, third trimester, fetus 1: Secondary | ICD-10-CM | POA: Insufficient documentation

## 2016-01-30 NOTE — Procedures (Addendum)
Fetal Non-Stress Test  Patient: Carrie Velazquez  Age: 25 y.o.  Date of Birth: 22-Jul-1990  ZOX:WRUEAVSex:female  MRN: [id number]  GA: 3986w5d    Reason for exam: Polyhydramnios     Maternal 88  106/60            Education Done:   External fetal monitoring: yes   Fetal kick counts: yes   NST: yes   VAS: no   Manual stimulation:no    Other     Fetal non-stress test for singleton pregnancy  Pre Procedure Diagnosis: Polyhydramnios in first trimester, antepartum complication  Post Procedure Diagnosis: NST (non-stress test) reactive            NST Start Time:  01/30/2016 12:04 PM            Uterine Irritability: No            Contractions: rare    NST Fetus A  Variability: moderate  FHR Baseline: 150  Decelerations: none   Accelerations: at least 15 BPM for 15 seconds  Stimulation: none  NST Interpretation:     End Time:  01/30/2016 12:29 PM          Duration of test (min): 25.2  Location of NST Fetal Heart Tracing: Archived electronically in CPN        Test performed By: Vanessa DurhamJulianne Wesolowski, RN  01/30/2016 12:10 PM     Dalene Seltzeruth Anne Jyla Hopf, MD  Maternal Fetal Medicine  [phone]

## 2016-02-03 ENCOUNTER — Observation Stay
Admission: AD | Admit: 2016-02-03 | Payer: Commercial Managed Care - PPO | Source: Ambulatory Visit | Admitting: Obstetrics and Gynecology

## 2016-02-04 ENCOUNTER — Inpatient Hospital Stay
Admission: AD | Admit: 2016-02-04 | Discharge: 2016-02-07 | DRG: 541 | Disposition: A | Discharge status: Home / Self Care | Payer: Commercial Managed Care - PPO | Source: Ambulatory Visit | Attending: Obstetrics and Gynecology | Admitting: Obstetrics and Gynecology

## 2016-02-04 DIAGNOSIS — E039 Hypothyroidism, unspecified: Secondary | ICD-10-CM | POA: Diagnosis present

## 2016-02-04 DIAGNOSIS — Z302 Encounter for sterilization: Secondary | ICD-10-CM

## 2016-02-04 DIAGNOSIS — F17210 Nicotine dependence, cigarettes, uncomplicated: Secondary | ICD-10-CM | POA: Diagnosis present

## 2016-02-04 DIAGNOSIS — Z3490 Encounter for supervision of normal pregnancy, unspecified, unspecified trimester: Secondary | ICD-10-CM

## 2016-02-04 DIAGNOSIS — O99334 Smoking (tobacco) complicating childbirth: Secondary | ICD-10-CM | POA: Diagnosis present

## 2016-02-04 DIAGNOSIS — Z9851 Tubal ligation status: Secondary | ICD-10-CM

## 2016-02-04 DIAGNOSIS — O403XX0 Polyhydramnios, third trimester, not applicable or unspecified: Principal | ICD-10-CM | POA: Diagnosis present

## 2016-02-04 DIAGNOSIS — Z3A39 39 weeks gestation of pregnancy: Secondary | ICD-10-CM

## 2016-02-04 DIAGNOSIS — O99284 Endocrine, nutritional and metabolic diseases complicating childbirth: Secondary | ICD-10-CM | POA: Diagnosis present

## 2016-02-04 LAB — CBC
Hematocrit: 28 % — ABNORMAL LOW (ref 34–45)
Hemoglobin: 9.1 g/dL — ABNORMAL LOW (ref 11.2–15.7)
MCH: 26 pg (ref 26–32)
MCHC: 33 g/dL (ref 32–36)
MCV: 80 fL (ref 79–95)
Platelets: 282 10*3/uL (ref 160–370)
RBC: 3.5 MIL/uL — ABNORMAL LOW (ref 3.9–5.2)
RDW: 13.3 % (ref 11.7–14.4)
WBC: 11 10*3/uL — ABNORMAL HIGH (ref 4.0–10.0)

## 2016-02-04 LAB — TYPE AND SCREEN
ABO RH Blood Type: A POS
Antibody Screen: NEGATIVE

## 2016-02-04 MED ORDER — PROMETHAZINE HCL 25 MG/ML IJ SOLN *I*
25.0000 mg | Freq: Once | INTRAMUSCULAR | Status: AC
Start: 2016-02-04 — End: 2016-02-04
  Administered 2016-02-04: 25 mg via INTRAMUSCULAR
  Filled 2016-02-04: qty 1

## 2016-02-04 MED ORDER — OXYTOCIN 30 UNITS IN 500ML NS WRAPPED *I*
1.0000 m[IU]/min | INTRAMUSCULAR | Status: DC
Start: 2016-02-04 — End: 2016-02-05
  Administered 2016-02-05: 2 m[IU]/min via INTRAVENOUS
  Administered 2016-02-05: 4 m[IU]/min via INTRAVENOUS
  Administered 2016-02-05: 5 m[IU]/min via INTRAVENOUS
  Administered 2016-02-05: 6 m[IU]/min via INTRAVENOUS
  Filled 2016-02-04: qty 500

## 2016-02-04 MED ORDER — LACTATED RINGERS IV SOLN *I*
150.0000 mL/h | INTRAVENOUS | Status: DC
Start: 2016-02-04 — End: 2016-02-05
  Administered 2016-02-05: 150 mL/h via INTRAVENOUS

## 2016-02-04 MED ORDER — ALUM & MAG HYDROXIDE-SIMETH 200-200-20 MG/5ML PO SUSP *I*
30.0000 mL | ORAL | Status: DC | PRN
Start: 2016-02-04 — End: 2016-02-05

## 2016-02-04 MED ORDER — MISOPROSTOL 25 MCG QUARTER TAB *I*
50.0000 ug | ORAL_TABLET | ORAL | Status: DC
Start: 2016-02-04 — End: 2016-02-05
  Administered 2016-02-04: 50 ug via ORAL
  Filled 2016-02-04: qty 2

## 2016-02-04 MED ORDER — MORPHINE SULFATE 10 MG/ML IV SOLN *WRAPPED*
15.0000 mg | Freq: Once | INTRAVENOUS | Status: AC
Start: 2016-02-04 — End: 2016-02-04
  Administered 2016-02-04: 15 mg via INTRAMUSCULAR
  Filled 2016-02-04: qty 2

## 2016-02-04 MED ORDER — LACTATED RINGERS IV BOLUS *I*
1000.0000 mL | INTRAVENOUS | Status: DC | PRN
Start: 2016-02-04 — End: 2016-02-05
  Administered 2016-02-04 – 2016-02-05 (×2): 1000 mL via INTRAVENOUS

## 2016-02-04 NOTE — Progress Notes (Signed)
OBSTETRICS PROGRESS NOTE       Subjective     In to discuss next steps with patient and her partner. Patient now amenable to attempting a cervical foley and pitocin. She is still feeling mild cramps with some of her contractions.       Objective     Vitals:    02/04/16 1953 02/04/16 2037 02/04/16 2045 02/04/16 2145   BP:  113/73     Pulse:  93     Resp: 18 18 18 18    Temp:  37.4 C (99.3 F)     TempSrc:  Oral     SpO2:       Weight:       Height:           Most recent cervical exam:   OB Examiner: O'connell (02/04/16 1953)  **Dilation: 1  **Effacement (%): 80  **Station: -2    Membranes:   Membrane Status: Intact           Fetal Monitoring:  Baseline: 140 bpm  Variability: moderate  Accelerations: present  Decelerations: None  Category: 1  Toco: every 1-3 minutes      Assessment & Plan     Carrie Velazquez is a 25 y.o. G2P1001 at 3965w3d admitted for IOL for polyhydramnios.    1. FHT: Category I. Interventions: continue CEFM.  2. Labor assessment: s/p misoprostol x1. Will place cervical foley and start pitocin as contraction pattern allows.  3. Pain management: M&P ordered to be given prior to cervical foley.  4. GBS status: negative.  5. Labor Risks:   - poly, AFI 27  - suspected LGA  - CF carrier, FOB negative  - anemia    Seen and discussed with Dr. Pam DrownQueenan    Carrie Caamano, MD  Obstetrics & Gynecology Resident, PGY-3  Pager# [phone]  02/04/2016  10:38 PM

## 2016-02-04 NOTE — H&P (Signed)
OBSTETRICS ADMISSION HISTORY & PHYSICAL      Reason for Admission (Chief Complaint): IOL    HPI     Carrie Velazquez is a 25 y.o. G2P1001 at 66w3dby early ultrasound, LMP c/w ultrasound with pregnancy complicated by risks outlined below who presents for IOL for polyhydramnios.      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    Obstetrical History     OB History   Gravida Para Term Preterm AB Living   _0 SAB TAB Ectopic Multiple Live Births       1      # Outcome Date GA Lbr Len/2nd Weight Sex Delivery Anes PTL Lv   2 Current            1 Term 12/06/10 4046w3d:39 / 01:05 3561 g (7 lb 13.6 oz) F Vag-Spont EPI,Local N LIV          Allergies   No Known Allergies (drug, envir, food or latex)    Current Home Medications     Prior to Admission medications    Medication Sig Start Date End Date Taking? Authorizing Provider   ferrous sulfate 325 (65 FE) MG tablet Take 1 tablet (325 mg total) by mouth 2 times daily (with meals) 11/21/15   Harcleroad, Tammy A, NP   NONFORMULARY, OTHER, ORDER *I* Pregnancy support belt 10/04/15   IbrMallie MusselD        GYN History, Social History, and Family History reviewed and updated in eRecord.      Review of Systems     Constitutional: Negative for fever or fatigue.  Psych: Denies depressive symptoms or anxiety.  Cardiovascular: Denies chest pain or palpitations.  Respiratory: Denies shortness of breath or orthopnea.  Gastrointestinal: Denies nausea/vomiting.  Denies RUQ abdominal pain.    Musculoskeletal: Denies muscle weakness.  Skin/Extremities: Endorses peripheral edema with prolonged standing.    Neurologic: Endorses headache (last night, better now). Denies dizziness or vision changes.  Genitourinary:  Denies vaginal bleeding.  Denies leakage of fluid.          Prenatal Labs     No results for input(s): WBC, HGB, HCT, PLT in the last 168  hours.  No results for input(s): NA, K, CL, CO2, UN, CREAT, GFRC, GLU in the last 168 hours. No results for input(s): LD, URIC, ALT, AST, ALK, TB in the last 168 hours.   No results for input(s): UTPR, UCRR in the last 168 hours.           Lab results: 01/14/16  2237 07/06/15  1232   ABO RH Blood Type  --  A RH POS   Rubella IgG AB  --  POSITIVE   Group B Strep Culture .  --    Syphilis Screen  --  Neg   HIV 1&2 ANTIGEN/ANTIBODY  --  Nonreactive   HBV S Ag  --  NEG        Lab results: 11/07/15  1446   Glucose,50gm 1HR 118      No results for input(s): GL0, GL1H, GL2H, GL3H in the last 8760 hours.        Prenatal Ultrasound Review (Pertinent Findings):  USN 8/16  -AFI 27.2   - 3430g 92%ile USN 8/8  - HC/AC 0.96 USN 8/8      Physical Exam     Vitals:    02/04/16 1406 02/04/16 1408 02/04/16 1411   BP:  110/58    Pulse: 96 103    Resp: 18     Temp: 37.2 C (99 F)     TempSrc: Oral     SpO2: 99%     Weight:   74.8 kg (165 lb)   Height:   1.6 m (_0 )       General Appearance: healthy  Mental Status: Alert and oriented x 3  Mood/Affect: Affect  stable  Skin: color normal  HEENT: Normocephalic, atraumatic.  Cardiovascular: Regular rate and rhythm with no murmurs  Respiratory: Clear to  auscultate  Abdomen: Soft, gravid, non-tender.   Uterus: Gravid.  Non-tender to palpation.  Neurological: Not assessed  Extremities: normal    External Genitalia: Unremarkable external genitalia without lesions.  Normal appearing labia majora/minora and introitus.  Vagina: Pink, ruggated vaginal mucosa without lesions.  No discharge.  No bleeding.  Cervix: Cervical examination as per below.  No lesions.  No leakage of fluid.      Pelvic Exam: FT/50/-2          Estimated Fetal Weight: 4000 by recent utrasound plus 30g/day    Presentation: cephalic by ultrasound    Placental location: Anterior by 8/16 [place]      Fetal Monitoring:  Baseline: 135 bpm  Variability: Moderate (6-25 BPM)  Accelerations: Yes 15X15  Decelerations: None  Category: 1  Toco: q7-9      Assessment & Plan     Carrie Velazquez is a 25 y.o. G2P1001 at 43w3dwith pregnancy complicated by risks outlined above admitted for IOL for polyhydramnios.    Admit to L &D   - Insert IV   - CBC, T&S, and Syphilis screen sent on admission.   - Cervix: FT / Membranes: intact   - Presentation: vertex / EFW: 4000 by [place] extrap[place]   - Category 1 fetal heart tracing.  Continuous EFM.   - Due to: No previous uterine incision, Singleton pregnancy, ?4 previous vaginal births, No known bleeding disorder, no h/o pf PPH, risk of PPH is: Low (Type & Screen)    Labor Plan   - Augmentation with Miso for now, pitocin as necessary, and epidural when appropriate    Postpartum planning   - Rh positive / Rubella immune /HIV  negative / GBS negative   - Infant: female. Circumcision: requested.   - Feeding: breast   - PPBC: tubal ligation   - Immunizations: Rubella Immune.  Last received TDAP in hospital 2012, declined 12/18/15    D/w Dr. Jeanella Cara MD  EM PGY 1

## 2016-02-04 NOTE — Progress Notes (Signed)
OB INTRAPARTUM PROGRESS NOTE    Subjective:     Pain intermittent with ctxs but tolerable.  No nausea.        Objective:  Vitals:    02/04/16 1406 02/04/16 1408 02/04/16 1411 02/04/16 1707   BP:  110/58  118/72   Pulse: 96 103  77   Resp: 18      Temp: 37.2 C (99 F)      TempSrc: Oral      SpO2: 99%      Weight:   74.8 kg (165 lb)    Height:   1.6 m (5\' 3" )        Most recent cervical exam:   OB Examiner: Dunkman,MD  (02/04/16 1500)  **Dilation: Fingertip  **Effacement (%): 50  **Station: -2    Membranes:   Membrane Status: Intact           Fetal Monitoring:  Baseline: 160 bpm  Variability: moderate  Accelerations: present  Decelerations: Absent  Category: 1  Toco: regular q2 minutes      Assessment/Plan:  Carrie Velazquez is a [age] y.o. 442P1001 female at 5819w3d with pregnancy admitted for IOL for polyhydramnios.    1. FHT: Category I. Interventions: CEFM.  2. Labor assessment: misoX1. Next due at 2000 but pt ctxs increasing in frequency.    3. Pain management: desires epidural.  4. GBS status: negative.   5. Labor Risks:     Anemia  Polyhydramnios(AFI 27.2)  LGA.      Elicia Lampavid Ryland Willona Phariss, MD  02/04/2016  6:58 PM

## 2016-02-04 NOTE — Progress Notes (Signed)
Report given to and care assumed by T. Siebert-Hochreiter, RN. Carrie Vi K Joyce Rocchio, RN

## 2016-02-04 NOTE — Progress Notes (Signed)
OBSTETRICS PROGRESS NOTE       Subjective     In to see pt.  Resting comfortably on bed.  Reports increased sensation of contractions.  No discomfort in between.  Requested another blanket--a warmed one was provided.       Objective     Vitals:    02/04/16 1406 02/04/16 1408 02/04/16 1411   BP:  110/58    Pulse: 96 103    Resp: 18     Temp: 37.2 C (99 F)     TempSrc: Oral     SpO2: 99%     Weight:   74.8 kg (165 lb)   Height:   1.6 m (5\' 3" )       Most recent cervical exam:   OB Examiner: Toriano Aikey, MD (02/04/16 1651)  **Dilation: Fingertip    Membranes:   Membrane Status: Intact           Fetal Monitoring:  Baseline: 155 bpm  Variability: moderate  Accelerations: present  Decelerations: None  Category: 1  Toco: q2-3      Assessment & Plan     Carrie Velazquez is a 25 y.o. G2P1001 at 5833w3d admitted for IOL for polyhydramnios.    1. FHT: Category I. Interventions: CEFM.  2. Labor assessment: s/p miso x 1.  3. Pain management: anticipating eventual epidural.  4. GBS status: negative.  5. Labor Risks:   - Anemia  -Polyhydramnios (AFI 27.2)  -LGA       Gerhard PerchesAndrew Ronny Korff MD  EM PGY 1

## 2016-02-04 NOTE — Progress Notes (Signed)
Report given to Meribeth MattesJaime Singer RN, resumes care of pt

## 2016-02-04 NOTE — Progress Notes (Signed)
OBSTETRICS PROGRESS NOTE       Subjective     In to place cervical foley. Patient comfortable with M&P      Objective     Vitals:    02/04/16 1953 02/04/16 2037 02/04/16 2045 02/04/16 2145   BP:  113/73     Pulse:  93     Resp: 18 18 18 18    Temp:  37.4 C (99.3 F)     TempSrc:  Oral     SpO2:       Weight:       Height:           Most recent cervical exam:   OB Examiner: Pavielle Biggar (02/04/16 2313)  **Dilation: 4  **Effacement (%): 80  **Station: -2     Patient initially 1/80/-2, cervical foley placed and inflated to 20cc, placement then checked after stylet removed and the cervical foley was in the vagina. Cervix then re-examined and noted to be 4/80/-2, as the foley bulb released the cervix from the membranes    Membranes:   Membrane Status: Intact           Fetal Monitoring:  Baseline: 150 bpm  Variability: moderate  Accelerations: present  Decelerations: occasional variable decels  Category: 2  Toco: every 1-3 minutes although not currently tracing well      Assessment & Plan     Carrie Velazquez is a 25 y.o. G2P1001 at 2483w3d admitted for IOL for polyhydramnios.    1. FHT: Category II occasional variable decels. Interventions: continue CEFM.  2. Labor assessment: s/p misoprostol x1.   - will start pitocin 2x2  3. Pain management: M&P ordered to be given prior to cervical foley.  4. GBS status: negative.  5. Labor Risks:   - poly, AFI 27  - suspected LGA  - CF carrier, FOB negative  - anemia      Jacqualine CodeKatherine Commodore Bellew, MD  Obstetrics & Gynecology Resident, PGY-3  Pager# [phone]  02/04/2016  11:21 PM

## 2016-02-04 NOTE — Progress Notes (Signed)
Report & transfer of care received at 2300. Pt was to get a cervical foley placed, but was checked to be 4cm. Discussed starting Pitocin-- will start when her contractions space out. Pt comfortable s/p M&P. Meribeth MattesJaime Bodie Abernethy, RN

## 2016-02-05 ENCOUNTER — Inpatient Hospital Stay: Payer: Commercial Managed Care - PPO | Admitting: Anesthesiology

## 2016-02-05 ENCOUNTER — Encounter: Payer: Self-pay | Admitting: Obstetrics and Gynecology

## 2016-02-05 LAB — SYPHILIS SCREEN
Syphilis Screen: NEGATIVE
Syphilis Status: NONREACTIVE

## 2016-02-05 LAB — HOLD SST

## 2016-02-05 MED ORDER — FENTANYL CITRATE 50 MCG/ML IJ SOLN *WRAPPED*
INTRAMUSCULAR | Status: DC | PRN
Start: 2016-02-05 — End: 2016-02-05
  Administered 2016-02-05 (×2): 50 ug via EPIDURAL

## 2016-02-05 MED ORDER — LIDOCAINE HCL 1 % IJ SOLN *I*
INTRAMUSCULAR | Status: DC | PRN
Start: 2016-02-05 — End: 2016-02-05
  Administered 2016-02-05: 3 mL via SUBCUTANEOUS

## 2016-02-05 MED ORDER — IBUPROFEN 600 MG PO TABS *I*
600.0000 mg | ORAL_TABLET | Freq: Four times a day (QID) | ORAL | Status: DC | PRN
Start: 2016-02-05 — End: 2016-02-06
  Administered 2016-02-05: 600 mg via ORAL
  Filled 2016-02-05: qty 1

## 2016-02-05 MED ORDER — LIDOCAINE-EPINEPHRINE (PF) 1.5 %-1:200000 IJ SOLN *I*
INTRAMUSCULAR | Status: DC | PRN
Start: 2016-02-05 — End: 2016-02-05
  Administered 2016-02-05: 3 mL via EPIDURAL

## 2016-02-05 MED ORDER — POLYETHYLENE GLYCOL 3350 PO PACK 17 GM *I*
17.0000 g | PACK | Freq: Every day | ORAL | Status: DC | PRN
Start: 2016-02-05 — End: 2016-02-07
  Filled 2016-02-05 (×2): qty 17

## 2016-02-05 MED ORDER — ACETAMINOPHEN 325 MG PO TABS *I*
650.0000 mg | ORAL_TABLET | Freq: Four times a day (QID) | ORAL | Status: DC | PRN
Start: 2016-02-05 — End: 2016-02-07

## 2016-02-05 MED ORDER — LACTATED RINGERS IV SOLN *I*
50.0000 mL/h | INTRAVENOUS | Status: AC
Start: 2016-02-05 — End: 2016-02-05
  Administered 2016-02-05: 50 mL/h via INTRAVENOUS

## 2016-02-05 MED ORDER — ONDANSETRON HCL 4 MG PO TABS *I*
8.0000 mg | ORAL_TABLET | Freq: Three times a day (TID) | ORAL | Status: DC | PRN
Start: 2016-02-05 — End: 2016-02-07

## 2016-02-05 MED ORDER — DIPHENHYDRAMINE HCL 25 MG PO TABS *I*
25.0000 mg | ORAL_TABLET | Freq: Every evening | ORAL | Status: DC | PRN
Start: 2016-02-05 — End: 2016-02-07

## 2016-02-05 MED ORDER — BUPIVACAINE HCL 0.25 % IJ SOLUTION *WRAPPED*
Status: DC | PRN
Start: 2016-02-05 — End: 2016-02-05
  Administered 2016-02-05 (×2): 4 mL via EPIDURAL

## 2016-02-05 MED ORDER — FENTANYL 2 MCG/ML AND 0.125% BUPIVACAINE *I*
INTRAMUSCULAR | Status: DC
Start: 2016-02-05 — End: 2016-02-05
  Filled 2016-02-05: qty 250

## 2016-02-05 MED ORDER — CALCIUM CARBONATE ANTACID 500 MG PO CHEW *I*
1000.0000 mg | CHEWABLE_TABLET | Freq: Three times a day (TID) | ORAL | Status: DC | PRN
Start: 2016-02-05 — End: 2016-02-07

## 2016-02-05 MED ORDER — LIDOCAINE HCL 1 % IJ SOLN *I*
INTRAMUSCULAR | Status: DC
Start: 2016-02-05 — End: 2016-02-05
  Filled 2016-02-05: qty 30

## 2016-02-05 MED ORDER — OXYTOCIN 30 UNITS/500 ML NS *POSTPARTUM* *I*
100.0000 m[IU]/min | INTRAMUSCULAR | Status: AC
Start: 2016-02-05 — End: 2016-02-05
  Administered 2016-02-05 (×2): 100 m[IU]/min via INTRAVENOUS
  Filled 2016-02-05: qty 500

## 2016-02-05 MED ORDER — FENTANYL 2 MCG/ML AND 0.125% BUPIVACAINE *I*
10.0000 mL/h | INTRAMUSCULAR | Status: DC | PRN
Start: 2016-02-05 — End: 2016-02-05
  Administered 2016-02-05: 10 mL/h via EPIDURAL

## 2016-02-05 MED ORDER — FENTANYL CITRATE 50 MCG/ML IJ SOLN *WRAPPED*
INTRAMUSCULAR | Status: DC
Start: 2016-02-05 — End: 2016-02-05
  Filled 2016-02-05: qty 2

## 2016-02-05 NOTE — Progress Notes (Signed)
OB INTRAPARTUM PROGRESS NOTE    Subjective:     Pt denies any pain s/p epidural.  Denies pressure.  No complaints at this time.       Objective:  Vitals:    02/05/16 0730 02/05/16 0742 02/05/16 0800 02/05/16 0822   BP:  109/70 108/66 110/66   Pulse:  85 75 78   Resp:       Temp: 36.8 C (98.2 F)      TempSrc: Oral      SpO2:       Weight:       Height:           Most recent cervical exam:   OB Examiner: CongelosiMD (02/05/16 0504)  **Dilation: 6  **Effacement (%): 90  **Station: -1    Membranes:   Membrane Status: Bulging        Fetal Monitoring:  Baseline: 135 bpm  Variability: moderate  Accelerations: Absent  Decelerations: Absent  Category: 1  Toco: regular q2-3 minutes      Assessment/Plan:  Carrie Bandyikki is a 25 y.o. 622P1001 female at 8131w4d with pregnancy admitted for IOL for polyhydramnios.    1. FHT: Category I. Interventions: CEFM.  2. Labor assessment: pitocin started.    3. Pain management: epidural in place.  4. GBS status: negative.   5. Labor Risks:   -polyhydramnios, AFI 27  -Suspected LGA  -CF carrier, FOB negative  -Anemia    Carrie RobJeffrey A Sabel Hornbeck, MD  02/05/2016  8:33 AM

## 2016-02-05 NOTE — Anesthesia Preprocedure Evaluation (Signed)
Anesthesia Pre-operative History and Physical for Carrie DelayNikki Velazquez    ______________________________________________________________________________________  CPM Assessment Not Completed  <URMCANSURGSITE>  Anesthesia Evaluation Information Source: patient, records     ANESTHESIA  Pertinent(-):  history of anesthetic complications (never had GA), Family Hx of Anesthetic Complications    GENERAL  Pertinent (-):  obesity, history of anesthetic complications (never had GA), Family Hx of Anesthetic Complications    HEENT    + Corrective Eyewear            glasses PULMONARY    + Smoker            former  Pertinent(-): asthma, recent URI, COPD  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    CARDIOVASCULAR  Pertinent(-):  hypertension, dysrhythmias    GI/HEPATIC/RENAL  Pertinent(-):  liver  issues, renal issues NEURO/PSYCH  Pertinent(-):  seizures, cerebrovascular event, neuromuscular disease    ENDO/OTHER    + Thyroid Disease (nodule)    Pertinent(-):  diabetes mellitus    HEMALOGIC    + Blood dyscrasia (Hct 28)            anemia  Pertinent(-):  bruises/bleeds easily, coagulopathy       Physical Exam    Airway            Mouth opening: normal            Mallampati: II            TM distance (fb): >3 FB            Neck ROM: full            Airway Impression: easy  Dental   Normal Exam   Cardiovascular           Rhythm: regular           Rate: normal       Pulmonary     breath sounds clear to auscultation    Mental Status     oriented to person, place and time       ________________________________________________________________________  Plan  ASA Score  2  Anesthetic Plan epidural    ; Line ( use current access); Monitoring (- BP, pulse oximetry, FHT); Positioning (lateral decubitus); Pain (caudal/epidural)    Informed Consent     Risks:          Risks discussed were commensurate with the plan listed above with the following specific points: hypotension, headache, failed block and infection , damage to:(nerves, blood vessels), allergic  Rx, unexpected serious injury    Anesthetic Consent:      Anesthetic plan (and risks as noted above) were discussed with patient    Plan also discussed with team members including:  CRNA and surgeon    Attending Attestation:  As the primary attending anesthesiologist, I attest that the patient or proxy understands and accepts the risks and benefits of the anesthesia plan. I also attest that I have personally performed a pre-anesthetic examination and evaluation, and prescribed the anesthetic plan for this particular location within 48 hours prior to the anesthetic as documented. Verdie ShireINTAE Rinaldo Macqueen, MD 5:35 AM

## 2016-02-05 NOTE — Anesthesia Procedure Notes (Signed)
---------------------------------------------------------------------------------------------------------------------------------------    NEURAXIAL BLOCK PLACEMENT  Labor Epidural    Date of Procedure: 02/05/2016 5:39 AM    Patient Location:  L&D    Reason for Block: labor epidural    CONSENT AND TIMEOUT     Consent:  Obtained per policy    Timeout: patient identified (name/DOB)   METHOD:    Patient Position: left lateral decubitus    Monitoring: blood pressure and continuous pulse oximetry    Sedation Used: no            For medications used, please see MAR    Prep: aseptic technique per protocol, povidone-iodine and patient draped      Successful Approach: midline    Successful Location: L3-4    Technique: LOR air    Attempts (Skin Punctures):  1  NEEDLE AND CATHETER:  Needle:    Needle Type: Tuohy    Needle Gauge:  18 G    Needle Length:  9 cm    Needle Insertion Depth:  5  Catheter:     Catheter in Space:  6    Catheter at Skin: 11  TESTING AND VALIDATION:    Catheter Aspirate: negative      Test Dose Response: negative  OBSERVATIONS:    Block Completion:  Successfully completed  Wet Tap: No      Paresthesia: momentary (RLE during cathter placement)  Neuraxial Blood: blood not aspirated      Patient Reaction to Block: tolerated procedure well  STAFF     Performed by: extender under direct supervision    Attending Attestation: I was present for the entire procedure     Attending: Verdie ShireKIM, Vedha Tercero  Extender: Dicie BeamGALLAHAN, CRAIG  ----------------------------------------------------------------------------------------------------------------------------------------

## 2016-02-05 NOTE — Interdisciplinary Rounds (Signed)
Interdisciplinary Care Note    Date: 02/05/2016   Time: 9:57 AM   Attendance:  Resident, Fellow, Attending Physician, Nurse Practitioner, Registered Nurse and Nurse Midwife      Admit Date/Time:  02/04/2016  1:57 PM    Principal Problem: <principal problem not specified>  Problem List:   Patient Active Problem List    Diagnosis Date Noted    Polyhydramnios 01/17/2016     Priority: High     8/8 U/S 23.68 cm  01/23/16 27cm    <> weekly NST until delivery  <> discussed IOL at 39 weeks.  Patient would like to move forward with IOL      LGA (large for gestational age) fetus affecting mother, antepartum 10/01/2015     Priority: High     Pelvis proven to 3561g  U/S 8/8: EFW 92% (3430g); polyhydramnios  Discussed risks of shoulder dystocia      Prenatal care 07/06/2015     Priority: High     CLINIC SITE: COB  Varicella status: childhood history  Dental care: Has not had a cleaning in a long time, resistant to dental care.     EDC determined by LMP c/w early USN                         Immunizations     [x]  Flu vaccine: declines  []  TDaP vaccine: (Last received in the hospital 2012) - Declined on 12/18/15    Genetic Screening     [x]    Declined    Baseline / first trimester     [x]  Blood type:  07/06/15 -- A+  [x]  Rubella status: 07/06/15 -- Immune  [x]  Hep B, syphilis, HIV screens:  Negative x3 on 07/06/15 (reviewed with patient)  [x]  Urine culture:   08/02/15 negative   [x]  UCDS:   07/06/15 negative  [x]  GC/CT DNA amplifications: negative 07/06/15  [x]  Cystic fibrosis mutation screening:  done in 2011, CF carrier (reports that her husband is CF negative)  [x]  Hemoglobin electrophoresis: Normal 2011    Second trimester     [x]  Anatomic ultrasound: completed 10/01/15  [x]  Repeat CBC:          Lab results: 11/07/15  1446   Hematocrit 29*      [x]  1-hour glucose tolerance test:  Ordered 10/24/2015.          Lab results: 11/07/15  1446   Glucose,50gm 1HR 118         Third trimester / L&D planning     [x]  GBS:     neg      Lab results:  01/14/16  2237   Group B Strep Culture .     [x]  Infant gender: female, "[name]"  [x]  Circumcision: desires  [x]  Feeding plan: breast feeding (she breast fed her daughter for three months)  [x]  Postpartum contraception: BTL - DSS signed on 01/22/2016   [x]  Pediatrician: She is transitioning pediatricians.      Polyhydramnios  8/8 U/S 23.68 cm, MVP8.4cm   <> repeat AFI in 01/23/2016  <> weekly NST until delivery  <> discussed IOL between 39 and 40 weeks depending on favorability of cervix      Psoriasis 10/16/2010     Priority: Low     Class: Chronic    Term pregnancy 02/04/2016    Pain in symphysis pubis during pregnancy 12/18/2015     Advised pt to use Tylenol and try stretches   Pt has tried maternity  belts  Offered walker on 12/18/2015 - pt declined      Anemia 11/21/2015     Hct 29; iron prescribed      Thyroid nodule 03/06/2011     Thyroid nodule on R side of thyroid, referred to PCP and labs done, 03/06/2011           Cystic fibrosis carrier      FOB tested in previous pregnancy & is negative         The patient's problem list and current medication list were reviewed.   Consultations/ Tests/ Treatments: None  FHR reviewed: yes  Vitals: Patient Vitals for the past 1 hrs:   BP Pulse   02/05/16 0930 112/72 95   02/05/16 0900 117/67 77        Previous Plan:  Pt being induced for polyhydramnios. S/p one dose of misoprostol. Started on pitocin at 05:10 today.     Plan     S/p one dose of misoprostol. Continue pitocin, epidural given.     Madalyn Rob, MD  Family Medicine R1  02/05/16 10:00          Anticipated Discharge Date:

## 2016-02-05 NOTE — Progress Notes (Signed)
OB INTRAPARTUM PROGRESS NOTE    Subjective:     Sensation of intermittent pressure.  Pain is intermittent, occurring with sensations of pressure, currently 7/10 severity.  Requests epidural.         Objective:  Vitals:    02/04/16 2145 02/04/16 2237 02/04/16 2320 02/05/16 0303   BP:  115/68 115/67 119/69   Pulse:  83 85 64   Resp: 18  17    Temp:   37 C (98.6 F) 36.4 C (97.5 F)   TempSrc:   Oral Oral   SpO2:       Weight:       Height:           Most recent cervical exam:   OB Examiner: CongelosiMD (02/05/16 0504)  **Dilation: 6  **Effacement (%): 90  **Station: -1    Membranes:   Membrane Status: Bulging           Fetal Monitoring:  Baseline: 130 bpm  Variability: moderate  Accelerations: present  Decelerations: Absent  Category: 1  Toco: regular, q4-575minutes      Assessment/Plan:  Carrie Velazquez is a 25 y.o. 292P1001 female at 1560w4d with pregnancy admitted for IOL for poly.    1. FHT: Category I. Interventions: CEFM.  2. Labor assessment: S/p misoX1 and cervical balloon.  Cervical progression to 6/90/-1.  Will begin pitocin.  3. Pain management: requests epidural.  4. GBS status: negative.  5. Labor Risks:     -poly, AFI 27  -Suspected LGA  -CF carrier, FOB negative  -Anemia    D/w Dr. Karmen Bongoongelosi.      Elicia Lampavid Ryland Kion Huntsberry, MD  02/05/2016  5:07 AM

## 2016-02-05 NOTE — Progress Notes (Signed)
OB INTRAPARTUM PROGRESS NOTE    Subjective:     Pt sleeping, deferred exam.  Did not disturb.  Tracing reviewed.        Objective:  Vitals:    02/04/16 2045 02/04/16 2145 02/04/16 2237 02/04/16 2320   BP:   115/68 115/67   Pulse:   83 85   Resp: 18 18  17    Temp:    37 C (98.6 F)   TempSrc:    Oral   SpO2:       Weight:       Height:           Most recent cervical exam:   OB Examiner: Congelosi (02/04/16 2313)  **Dilation: 4  **Effacement (%): 80  **Station: -2    Membranes:   Membrane Status: Intact           Fetal Monitoring:  Baseline: 125 bpm  Variability: moderate  Accelerations: present  Decelerations: Absent  Category: 1  Toco: q2-3 minutes, regular.      Assessment/Plan:  Carrie Velazquez is a 25 y.o. 392P1001 female at 8943w4d with pregnancy admitted for IOL for polyhydramnios.    1. FHT: Category I. Interventions: CEFM.  2. Labor assessment: s/p miso X1, pit 2X2, cervical foley.  3. Pain management: M&P administered.  4. GBS status: negative.   5. Labor Risks:     -poly, AFI 27  -Suspected LGA  -CF carrier, FOB negative  -Anemia    Elicia Lampavid Ryland Chrislyn Seedorf, MD  02/05/2016  2:12 AM

## 2016-02-05 NOTE — Progress Notes (Signed)
OB INTRAPARTUM PROGRESS NOTE    Subjective:     Tracing review.       Objective:  Vitals:    02/04/16 2145 02/04/16 2237 02/04/16 2320 02/05/16 0303   BP:  115/68 115/67 119/69   Pulse:  83 85 64   Resp: 18  17    Temp:   37 C (98.6 F) 36.4 C (97.5 F)   TempSrc:   Oral Oral   SpO2:       Weight:       Height:           Most recent cervical exam:   OB Examiner: Congelosi (02/04/16 2313)  **Dilation: 4  **Effacement (%): 80  **Station: -2    Membranes:   Membrane Status: Intact           Fetal Monitoring:  Baseline: 120 bpm  Variability: moderate  Accelerations: present  Decelerations: Absent  Category: 1  Toco: regular, 3-264minutes apart      Assessment/Plan:  Carrie Velazquez is a 25 y.o. 932P1001 female at 4947w4d with pregnancy admitted for IOL.    1. FHT: Category I. Interventions: CEFM.  2. Labor assessment: misoX1.  Cervical balloon placed and cervix found to have progressed to 4/80/-2.  Cervical balloon removed.   3. Pain management: M&P.  4. GBS status: negative.   5. Labor Risks:     -poly, AFI 27  -Suspected LGA  -CF carrier, FOB negative  -Anemia    Elicia Lampavid Ryland Roxane Puerto, MD  02/05/2016  3:48 AM

## 2016-02-05 NOTE — Progress Notes (Signed)
Report and transfer of care given to Gweneth DimitriKelsey Kiester RN on E3 at this time.    Hope BuddsKrysta K Isabelle Matt, RN

## 2016-02-05 NOTE — Progress Notes (Signed)
OB INTRAPARTUM PROGRESS NOTE    Subjective:     Pt denies any pain s/p epidural.  Denies pressure.  No complaints at this time.       Objective:  Vitals:    02/05/16 0555 02/05/16 0558 02/05/16 0559 02/05/16 0602   BP: 114/73 110/54 102/86 106/58   Pulse: 94 90 90 107   Resp:       Temp:       TempSrc:       SpO2:       Weight:       Height:           Most recent cervical exam:   OB Examiner: CongelosiMD (02/05/16 0504)  **Dilation: 6  **Effacement (%): 90  **Station: -1    Membranes:   Membrane Status: Bulging        Fetal Monitoring:  Baseline: 130 bpm  Variability: moderate  Accelerations: present  Decelerations: Absent  Category: 1  Toco: regular q2-3 minutes      Assessment/Plan:  Carrie Velazquez is a 25 y.o. 362P1001 female at 3460w4d with pregnancy admitted for IOL for polyhydramnios.    1. FHT: Category I. Interventions: CEFM.  2. Labor assessment: pitocin started.    3. Pain management: epidural in place.  4. GBS status: negative.   5. Labor Risks:     -poly, AFI 27  -Suspected LGA  -CF carrier, FOB negative  -Anemia    Elicia Lampavid Ryland Prudy Candy, MD  02/05/2016  6:24 AM

## 2016-02-05 NOTE — Progress Notes (Signed)
Assumed care of pt at this time. Report received from JSingerRN. Sharen HeckMeredith Olamide Lahaie, RN

## 2016-02-05 NOTE — Anesthesia Postprocedure Evaluation (Signed)
Anesthesia Post-Op Note    Patient: Carrie Velazquez    Procedure(s) Performed:  Procedure Summary     Date Anesthesia Start Anesthesia Stop Room / Location    02/05/16 0534 1127        Procedure Diagnosis Scheduled Providers Attending Anesthesia    LABOR ANALGESIA No diagnosis on file.  Junius Argyleurle, Francess Mullen E, MD        Recovery Vitals  BP: 118/67 (02/05/2016  2:34 PM)  Heart Rate: 80 (02/05/2016  2:34 PM)  Resp: 16 (02/05/2016  2:34 PM)  Temp: 37.5 C (99.5 F) (02/05/2016  2:34 PM)  SpO2: 96 % (02/05/2016  2:34 PM)  O2 Device: None (Room air) (02/05/2016  2:53 PM)   0-10 Scale: 0 (02/05/2016  2:53 PM)  Anesthesia type:  Epidural  Complications Noted During Procedure or in PACU:  No value filed.   Comment: No value filed.  Patient Location:  Labor and Delivery  Level of Consciousness:    Recovered to baseline  Patient Participation:     Able to participate  Temperature Status:    Normothermic  Oxygen Saturation:    Within patient's normal range  Cardiac Status:   Within patient's normal range  Fluid Status:    Stable  Airway Patency:     Yes  Pulmonary Status:    Baseline  Neuraxial Block Evaluation:    Block resolving  Pain Management:    Adequate analgesia  Nausea and Vomiting:  None    Post Op Assessment:    Tolerated procedure well (baby and patient doing well post delivery)   Attending Attestation:  All indicated post anesthesia care provided     -

## 2016-02-05 NOTE — Progress Notes (Signed)
Transfer of care and report given to KNardoneRN at this time Shagun Wordell, RN

## 2016-02-05 NOTE — Plan of Care (Signed)
Fetal Oxygenation    • Maintain umbilical cord blood flow Completed or Resolved    • Maintain adequate maternal oxygenation Completed or Resolved        Fetal Oxygenation    • Maximize maternal circulation during the intrapartum phase Completed or Resolved        Medicinal Interventions - Intrapartum    • Safe and appropriate administration of maternal medications during the intrapartum period Completed or Resolved        Uteroplacental perfusion    • Maintain adequate uteroplacental perfusion Completed or Resolved    • Identify the presence of a labor contraction pattern Completed or Resolved    • Reduce the risk of infection Completed or Resolved    • Maintain patient safety in the presence of vaginal bleeding. Completed or Resolved

## 2016-02-06 ENCOUNTER — Inpatient Hospital Stay: Payer: Commercial Managed Care - PPO | Admitting: Anesthesiology

## 2016-02-06 ENCOUNTER — Encounter
Admission: AD | Disposition: A | Discharge status: Home / Self Care | Payer: Self-pay | Source: Ambulatory Visit | Attending: Obstetrics and Gynecology

## 2016-02-06 DIAGNOSIS — Z9851 Tubal ligation status: Secondary | ICD-10-CM

## 2016-02-06 LAB — MCHC: MCHC: 31 g/dL — ABNORMAL LOW (ref 32–36)

## 2016-02-06 LAB — POCT GLUCOSE: Glucose POCT: 77 mg/dL (ref 60–99)

## 2016-02-06 LAB — HEMATOCRIT: Hematocrit: 26 % — ABNORMAL LOW (ref 34–45)

## 2016-02-06 SURGERY — LIGATION, FALLOPIAN TUBE, BILATERAL, POSTPARTUM
Anesthesia: Spinal | Site: Abdomen | Wound class: Clean

## 2016-02-06 MED ORDER — OXYCODONE-ACETAMINOPHEN 5-325 MG PO TABS *I*
2.0000 | ORAL_TABLET | ORAL | Status: DC | PRN
Start: 2016-02-06 — End: 2016-02-07
  Administered 2016-02-07: 2 via ORAL
  Filled 2016-02-06: qty 2

## 2016-02-06 MED ORDER — IBUPROFEN 600 MG PO TABS *I*
600.0000 mg | ORAL_TABLET | Freq: Four times a day (QID) | ORAL | Status: DC | PRN
Start: 2016-02-06 — End: 2016-02-07
  Administered 2016-02-06 – 2016-02-07 (×2): 600 mg via ORAL
  Filled 2016-02-06: qty 1

## 2016-02-06 MED ORDER — IBUPROFEN 600 MG PO TABS *I*
600.0000 mg | ORAL_TABLET | Freq: Three times a day (TID) | ORAL | 1 refills | Status: AC | PRN
Start: 2016-02-06 — End: 2016-03-07

## 2016-02-06 MED ORDER — MIDAZOLAM HCL 1 MG/ML IJ SOLN *I* WRAPPED
INTRAMUSCULAR | Status: AC
Start: 2016-02-06 — End: 2016-02-06
  Filled 2016-02-06: qty 2

## 2016-02-06 MED ORDER — FENTANYL CITRATE 50 MCG/ML IJ SOLN *WRAPPED*
INTRAMUSCULAR | Status: AC
Start: 2016-02-06 — End: 2016-02-06
  Filled 2016-02-06: qty 2

## 2016-02-06 MED ORDER — ONDANSETRON HCL 2 MG/ML IV SOLN *I*
4.0000 mg | Freq: Once | INTRAMUSCULAR | Status: AC | PRN
Start: 2016-02-06 — End: 2016-02-06
  Administered 2016-02-06: 4 mg via INTRAVENOUS
  Filled 2016-02-06: qty 2

## 2016-02-06 MED ORDER — BUPIVACAINE IN DEXTROSE 0.75-8.25 % IT SOLN *I*
INTRATHECAL | Status: DC | PRN
Start: 2016-02-06 — End: 2016-02-06
  Administered 2016-02-06: 1.2 mL via INTRATHECAL

## 2016-02-06 MED ORDER — PROPOFOL 10 MG/ML IV EMUL (INTERMITTENT DOSING) WRAPPED *I*
INTRAVENOUS | Status: AC
Start: 2016-02-06 — End: 2016-02-06
  Filled 2016-02-06: qty 20

## 2016-02-06 MED ORDER — LIDOCAINE HCL 1 % IJ SOLN *I*
INTRAMUSCULAR | Status: DC | PRN
Start: 2016-02-06 — End: 2016-02-06
  Administered 2016-02-06: 3 mL via SUBCUTANEOUS

## 2016-02-06 MED ORDER — ACETAMINOPHEN 500 MG PO TABS *I*
1000.0000 mg | ORAL_TABLET | Freq: Once | ORAL | Status: DC
Start: 2016-02-06 — End: 2016-02-06

## 2016-02-06 MED ORDER — KETOROLAC TROMETHAMINE 30 MG/ML IJ SOLN *I*
INTRAMUSCULAR | Status: DC | PRN
Start: 2016-02-06 — End: 2016-02-06
  Administered 2016-02-06: 30 mg via INTRAVENOUS

## 2016-02-06 MED ORDER — OXYCODONE-ACETAMINOPHEN 5-325 MG PO TABS *I*
1.0000 | ORAL_TABLET | ORAL | Status: DC | PRN
Start: 2016-02-06 — End: 2016-02-07
  Administered 2016-02-06: 1 via ORAL
  Filled 2016-02-06: qty 1

## 2016-02-06 MED ORDER — ONDANSETRON HCL 2 MG/ML IV SOLN *I*
INTRAMUSCULAR | Status: AC
Start: 2016-02-06 — End: 2016-02-06
  Filled 2016-02-06: qty 2

## 2016-02-06 MED ORDER — HYDROMORPHONE HCL PF 1 MG/ML IJ SOLN *WRAPPED*
0.4000 mg | INTRAMUSCULAR | Status: DC | PRN
Start: 2016-02-06 — End: 2016-02-06
  Administered 2016-02-06: 0.4 mg via INTRAVENOUS
  Filled 2016-02-06: qty 1

## 2016-02-06 MED ORDER — MIDAZOLAM HCL 1 MG/ML IJ SOLN *I* WRAPPED
INTRAMUSCULAR | Status: DC | PRN
Start: 2016-02-06 — End: 2016-02-06
  Administered 2016-02-06: 2 mg via INTRAVENOUS

## 2016-02-06 MED ORDER — DEXAMETHASONE SODIUM PHOSPHATE 4 MG/ML INJ SOLN *WRAPPED*
INTRAMUSCULAR | Status: AC
Start: 2016-02-06 — End: 2016-02-06
  Filled 2016-02-06: qty 1

## 2016-02-06 MED ORDER — KETOROLAC TROMETHAMINE 30 MG/ML IJ SOLN *I*
15.0000 mg | Freq: Once | INTRAMUSCULAR | Status: AC
Start: 2016-02-06 — End: 2016-02-06
  Administered 2016-02-06: 15 mg via INTRAVENOUS
  Filled 2016-02-06: qty 1

## 2016-02-06 MED ORDER — LIDOCAINE HCL 2 % (PF) IJ SOLN *I*
INTRAMUSCULAR | Status: AC
Start: 2016-02-06 — End: 2016-02-06
  Filled 2016-02-06: qty 5

## 2016-02-06 MED ORDER — FENTANYL CITRATE 50 MCG/ML IJ SOLN *WRAPPED*
INTRAMUSCULAR | Status: DC | PRN
Start: 2016-02-06 — End: 2016-02-06
  Administered 2016-02-06 (×2): 25 ug via INTRAVENOUS
  Administered 2016-02-06: 50 ug via INTRAVENOUS

## 2016-02-06 MED ORDER — NALOXONE HCL 0.4 MG/ML IJ SOLN *WRAPPED*
0.1000 mg | Status: DC | PRN
Start: 2016-02-06 — End: 2016-02-07

## 2016-02-06 MED ORDER — LACTATED RINGERS IV SOLN *I*
125.0000 mL/h | INTRAVENOUS | Status: AC
Start: 2016-02-06 — End: 2016-02-06

## 2016-02-06 MED ORDER — OXYCODONE-ACETAMINOPHEN 5-325 MG PO TABS *I*
1.0000 | ORAL_TABLET | ORAL | 0 refills | Status: AC | PRN
Start: 2016-02-06 — End: ?

## 2016-02-06 MED ORDER — LACTATED RINGERS IV SOLN *I*
150.0000 mL/h | INTRAVENOUS | Status: DC
Start: 2016-02-06 — End: 2016-02-07
  Administered 2016-02-06: 150 mL/h via INTRAVENOUS

## 2016-02-06 MED ORDER — IBUPROFEN 600 MG PO TABS *I*
600.0000 mg | ORAL_TABLET | Freq: Four times a day (QID) | ORAL | Status: DC | PRN
Start: 2016-02-06 — End: 2016-02-06

## 2016-02-06 MED ORDER — ROLLER WALKER MISC *A*
1.0000 | Freq: Once | 0 refills | Status: AC
Start: 2016-02-06 — End: 2016-02-06

## 2016-02-06 MED ORDER — ONDANSETRON HCL 2 MG/ML IV SOLN *I*
4.0000 mg | Freq: Three times a day (TID) | INTRAMUSCULAR | Status: DC | PRN
Start: 2016-02-06 — End: 2016-02-07

## 2016-02-06 SURGICAL SUPPLY — 24 items
ADHESIVE DERMABOND GLUE HI VISCOSITY (Dressing)
ADHESIVE SKIN CLOSURE 0.7ML DERMABOND ADVANCED (Dressing) ×2 IMPLANT
APPLICATOR CHLORAPREP 26ML ORANGE LARGE (Solution) ×3 IMPLANT
BLANKET UPPER BODY TEMP THERAPY (Drape) IMPLANT
BOWL STERILE MED 16OZ (Supply) ×3 IMPLANT
COVER MAYO STAND (Drape) ×1
COVER MAYO STD W24XL53IN 3 LAYR SMS RECOIL TECH DISP (Drape) IMPLANT
DRAPE UTILITY W/TAPE 15X26 (Drape) ×3 IMPLANT
FILTER NEPTUNE 4PORT MANIFOLD (Supply) ×3 IMPLANT
GLOVE SURG PROTEXIS PI 6.5 PF SYN (Glove) ×8 IMPLANT
GLOVE SURG PROTEXIS PI 7.0 PF SYN (Glove) ×12 IMPLANT
GOWN SIRUS FABRIC REINFORCED SET IN XL (Gown) ×5 IMPLANT
KNIT MATERNITY PANT 4XL (Other) ×3 IMPLANT
LUBRICANT JELLY 2.7GM STER (Supply) IMPLANT
NEEDLE HYPO SAFETYGLIDE 25G X 1IN (Needle) IMPLANT
PACK CUSTOM GENERAL PACK (Pack) ×3 IMPLANT
PAD OB W/ADH STRIP 11IN (Dressing) ×3 IMPLANT
SLEEVE COMP KNEE HI MED (Supply) ×3 IMPLANT
SUTR MONOCRYL PLUS 4-0 18PS2 (Suture) ×4 IMPLANT
SUTR PLAIN 3-0 CTX 27IN 873H YELLOW (Suture) ×4 IMPLANT
SUTR VICRYL ANTIB 0 UR-6 27 VIOLET (Suture) ×6 IMPLANT
SUTR VICRYL ANTIB 2-0 SH 27 UNDY (Suture) ×6 IMPLANT
SUTR VICRYL CTD 2-0 SUTUPAK VIOLET (Suture) ×6 IMPLANT
TOWEL SURG STERILE DISP BLUE (Towel) ×2 IMPLANT

## 2016-02-06 NOTE — Lactation Note (Signed)
This note was copied from a baby's chart.  [facility] Lactation Initial Visit:  Lowella Bandyikki is a G2/P2 who breastfed her first baby x 3 mo with introduction of formula.  Mom sitting on side of bed with walker in front of her, dad holding baby behind her in chair.  Baby totally swaddled. Began teaching by asking if parents knew about skin to skin.  FOB stated that they did.  Discussed frequency of feeds and asked if they had any questions for me at this point.  Mom stated that she did not, not making eye contact. Lowella Bandyikki seems very timid and unsure of herself, Kurtis seems very abrupt and Uncommunicative.  Encouraged them to call as needs today.  Lactation to follow up tomorrow.  Alene Miresarol L Ethelda Deangelo, RN ,[facility]  [facility] Lactation

## 2016-02-06 NOTE — Anesthesia Procedure Notes (Addendum)
---------------------------------------------------------------------------------------------------------------------------------------    AIRWAY   GENERAL INFORMATION AND STAFF    Patient location during procedure: OR       Date of Procedure: 02/06/2016 12:36 PM  CONDITION PRIOR TO MANIPULATION     Current Airway/Neck Condition:  Normal        For more airway physical exam details, see Anesthesia PreOp Evaluation  AIRWAY METHOD     Patient Position:  Sniffing    Preoxygenated: yes      Induction: Not needed  Mask Difficulty Assessment:  0 - not attempted  FINAL AIRWAY DETAILS    Final Airway Type:  Nasal cannula    Head position required to avoid obstruction:  Neutral  ----------------------------------------------------------------------------------------------------------------------------------------

## 2016-02-06 NOTE — Anesthesia Preprocedure Evaluation (Addendum)
Anesthesia Pre-operative History and Physical for Carrie Velazquez    ______________________________________________________________________________________    Summary:  25 yo F scheduled for a PPBTL with Dr. Malen Gauze    PMHx:  Current smoker  Hypothyroidism      By Fleet Contras, MD at 10:18 AM on 02/06/2016    <URMCANSURGSITE>  Anesthesia Evaluation Information Source: records, patient     ANESTHESIA  Pertinent(-):  history of anesthetic complications, Family Hx of Anesthetic Complications    GENERAL  Comment: Appears pale  Pertinent (-):  obesity, contact precautions, anesthesia monitoring restrictions, history of anesthetic complications, Family Hx of Anesthetic Complications    HEENT  Pertinent (-):   visual impairment, anatomic issues, nosebleeds PULMONARY    + Smoker            currently    CARDIOVASCULAR  Good(4+METs) Exercise Tolerance  Pertinent(-):  hypertension    GI/HEPATIC/RENAL  Last PO Intake: >8hr before procedure and >2hr before procedure (clears)  Pertinent(-):  GERD, hiatal hernia NEURO/PSYCH  Pertinent(-):  dizziness/motion sickness, psychiatric issues, cerebrovascular event, neuromuscular disease, Intellectual disability    ENDO/OTHER    + Thyroid Disease            HYPOthyroid   Pertinent(-):  diabetes mellitus    HEMALOGIC    + Blood dyscrasia            anemia  Pertinent(-):  bruises/bleeds easily, coagulopathy, anticoagulants, blood transfusion, autoimmune disease       Physical Exam    Airway            Mouth opening: normal            Mallampati: II            TM distance (fb): >3 FB            TM distance (cm): 5            Neck ROM: full  Dental   Normal Exam   Cardiovascular           Rhythm: regular           Rate: normal  No friction rub, systolic click, JVD or murmur    Neurologic    Normal Exam    General Survey    Normal Exam   Pulmonary     breath sounds clear to auscultation    No cough, rhonchi, decreased breath sounds, wheezes    Mental Status     oriented to person, place and  time    + Anxious    Not confused, depressed, agitated or impaired mental status     Operative Site    Normal Exam     ________________________________________________________________________  Plan  ASA Score  2  Anesthetic Plan spinal    Induction (routine IV); General Anesthesia/Sedation Maintenance Plan (propofol infusion and IV bolus); Airway (nasal cannula); Line ( use current access); Monitoring (standard ASA); Positioning (lithotomy); PONV Plan (dexamethasone and ondansetron); Pain (per surgical team); PostOp (PACU)    Informed Consent     Risks:          Risks discussed were commensurate with the plan listed above with the following specific points: N/V, aspiration, sore throat, hypotension, headache, failed block and infection , damage to:(nerves, eyes, teeth, blood vessels), allergic Rx, unexpected serious injury, awareness    Anesthetic Consent:      Anesthetic plan (and risks as noted above) were discussed with patient    Blood products Consent:  Use of blood products discussed with: patient and they consented    Plan also discussed with team members including:  surgeon and resident    Attending Attestation:  As the primary attending anesthesiologist, I attest that the patient or proxy understands and accepts the risks and benefits of the anesthesia plan. I also attest that I have personally performed a pre-anesthetic examination and evaluation, and prescribed the anesthetic plan for this particular location within 48 hours prior to the anesthetic as documented. Edwing Figley Maurene CapesHai Jonerik Sliker, MD 11:56 AM      Pt would like to have some sedation for her anxiety.  I informed the patient that the medicine can get into her breast milk.  She understood the risks and benefits and wish to have sedation.

## 2016-02-06 NOTE — Progress Notes (Signed)
Carrie Velazquez, Jeffrey A, MD ordered a Fracture Boot(MC Walker) from [facility]. Patient does not need a Boot. Order will be d/c by [facility].    Rondel JumboMichael J. Lella Mullany CFo  [facility]  [phone]

## 2016-02-06 NOTE — Anesthesia Procedure Notes (Deleted)
Procedures

## 2016-02-06 NOTE — Anesthesia Procedure Notes (Addendum)
---------------------------------------------------------------------------------------------------------------------------------------    NEURAXIAL BLOCK PLACEMENT  Single Shot Spinal      Patient Location:  OR    Reason for Block: intraoperative anesthetic and procedure for pain    CONSENT AND TIMEOUT     Consent:  Obtained per policy    Timeout: patient identified (name/DOB) , proper patient position verified, block site initialed by attending, needed equipment, monitors, medications and access verified as present and functioning , allergies reviewed with patient/record , all members of the block team participated in the timeout and anticoagulation/antiplatelet status reviewed  METHOD:    Patient Position: sitting    Monitoring: blood pressure and continuous pulse oximetry    Sedation Used: yes            For medications used, please see MAR    Level of Sedation: mild      Prep: aseptic technique per protocol and povidone-iodine      Successful Approach: midline    Successful Location: L3-4    Attempts (Skin Punctures):  1  SPINAL NEEDLE:      Introducer: 20 G    Type: Pencan    Gauge: 25 G    Length: 3.5 in    CSF Appearance: clear  OBSERVATIONS:    Block Completion:  Successfully completed    Sensory Levels: bilateral    Peak Sensory Level: T10    Paresthesia: none  Neuraxial Blood: blood not aspirated      Motor Block: dense    Patient Reaction to Block: tolerated procedure well, pain relief and vitals remained stable  STAFF     Performed by: extender under direct supervision    Attending Attestation: I was present for the entire procedure     Attending: Laveda NormanRAN, Xenia Nile HAI  Extender: Fleet ContrasFORNAROLA, LAURA  ----------------------------------------------------------------------------------------------------------------------------------------

## 2016-02-06 NOTE — H&P (Signed)
UPDATES TO PATIENT'S CONDITION on the DAY OF SURGERY/PROCEDURE    I. Updates to Patient's Condition (to be completed by a provider privileged to complete a H&P, following reassessment of the patient by the provider):    Day of Surgery/Procedure Update:  History  History reviewed and no change    Physical  Physical exam updated and no change            II. Procedure Readiness   I have reviewed the patient's H&P and updated condition. By completing and signing this form, I attest that this patient is ready for surgery/procedure.    III. Attestation   I have reviewed the updated information regarding the patient's condition and it is appropriate to proceed with the planned surgery/procedure.    Jamey ReasKatina Edgard Debord, MD as of 12:24 PM 02/06/2016

## 2016-02-06 NOTE — Progress Notes (Signed)
Physical Therapy    Initial Eval Completed.  Patient is a 25 y.o.female who presents with pubic symphysis pain after SVD.    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    *Bold Indicates co-morbidities affecting treatment and recovery    Comorbidities affecting treatment/recovery in addition to those listed above:  G2P2    Personal factors affecting treatment/recovery:   none identified    Clinical presentation:   stable       Patient complexity:  low level as indicated by above personal factors, environmental factors and comorbidities in addition to their impairments found on physical exam.     02/06/16 1001   Prior Living    Prior Living Situation Reported by patient   Lives With [facility] Help From Family;Independent   Type of Home split level home   # Steps to Enter Home 1   # Of Steps In Home 5   Location of Bedrooms 2nd floor;Could do 1st floor if set-up if needed  (Has been sleeping on couch)   Location of Bathrooms 1st floor   Medical Equipment in Home None   Prior Function Level   Prior Function Level Reported by patient   Transfers Independent   Transfer Devices None   Walking Independent   Walking assistive devices used None   Stair negotiation Independent   PT Tracking   PT TRACKING PT Assigned   Visit Number   Visit Number [facility]) / Treatment Day (HH) 1   Precautions/Observations    Precautions used Yes   LDA Observation None   Fall Precautions General falls precautions   Pain Assessment   *Is the patient currently in pain? Yes   Pain (Before,During, After) Therapy Before;During;After   0-10 Scale (Not listed on VAS. Mild to moderate depending on activity)   Pain Location Groin  (Pubic symphysis )   Pain Radiating Towards Mostly localized at symphysis. No pain or numbness down legs   Pain Descriptors Sore;Sharp   Pain Intervention(s) Repositioned   Vision    Current Vision No visual deficits   Cognition   Cognition No deficit noted   UE Assessment   UE Assessment Full range LUE AROM;Full range RUE AROM   LE Assessment   LE Assessment Full AROM RLE;Full AROM LLE  (Grossly full, some pain limitation)   Sensation   Sensation No apparent deficit   Bed Mobility   Bed mobility Tested   Rolling Modified independent (device)   Supine to Sit 1 person assist;Contact guard;Minimum assist ;Side rails up (#);Head of bed elevated  (Some assist to bring legs off edge of bed)   Additional comments Instructed pt in  log roll technique   Transfers   Transfers Tested   Sit to Stand Modified independent (device)   Stand to sit Modified independent (device)   Transfer Assistive Device rolling walker   Additional comments From and to bed   Mobility   Mobility Tested   Gait Pattern Decreased cadence;Decreased R step length;Decreased R step height;Decreased L step length;Decreased L step height   Ambulation Assist Modified independent (device)   Ambulation Distance (Feet) 400'   Ambulation Assistive Device rolling walker   Stairs Assistance Stand by;1 person assist   Stair Management Technique One rail;Sideways;Step to pattern   Number of Stairs 5   Additional comments Slow but steady with walker. Pt reports walker helps both with pain and balance. Pt reports walking eventually helps pain somewhat.     Therapeutic Exercises   Additional comments Gave written program and reviewed - abdominal and glut sets, Kegel  exercises.    Balance   Balance Tested   Sitting - Static Independent    Sitting - Dynamic Independent   Standing - Static Independent;Unsupported   Standing - Dynamic Independent;Unsupported   Additional Comments No loss of balance with SLS in standing, but improved overall (and decreased pain) with walker.    Assessment   Brief Assessment Patient demonstrates adequate mobility skills to return home;Family able to provide necessary assistance with mobility and exercise program   Patient / Family Goal Return home, decrease pain   Plan/Recommendation   Treatment Interventions No further PT interventions   PT Frequency One time visit;none further   Hospital Stay Recommendations Out of bed with nursing assist;Ambulate daily with nursing assist;May be out of bed with family assist;May ambulate with family assist   Discharge Recommendations Anticipate return to prior living arrangement;Other (comment)  (See details below)   PT Discharge Equipment Recommended Rolling Walker   Assessment/Recommendations Reviewed With: Nursing;Patient;Child psychotherapistocial Worker;Family   Additional Recommendations Discussed options with pt - home PT vs wait a week or so and see how she's feeling. If no improvement in that time, talk to her OB or primary for referral to outpatient PT. Pt chose wait and see approach. Reminded pt she can call her MD any time if any further concerns.   Time Calculation   PT Untimed Codes 28   PT Unbilled Time 0   PT Total Treatment 28   PT Charges   $HH PT Charges PT Eval Low Complexity - code 7161     Thamas JaegersKate My [place], [facility] Pal[facility] Intercommunity HospitalMSPT  Pager ID # [phone]  (text pager)

## 2016-02-06 NOTE — Progress Notes (Signed)
Pt back from surgery for BTL in stable condition. Jonetta Osgoodiane Natonya Finstad,RN.

## 2016-02-06 NOTE — Discharge Instructions (Signed)
Brief Summary of Your Hospital Course (including key procedures and diagnostic test results):    You were admitted for an induction of labor. You delivered a beautiful baby boy via vaginal delivery. Congratulations!!  You also underwent a postpartum tubal ligation 1 day after your delivery. Your postpartum course was uncomplicated. You are being discharged home in good condition.     Call your OB provider promptly if you experience any of the symptoms in the pre-written guidelines or instructions below. If you cannot reach your provider, call their answering service or 911.    Diet   Regular    Activity  See instructions below    Medical Equipment / Supplies  Prescription for electric breast pump    _____________________________________________________________      POSTPARTUM  INSTRUCTIONS    General Activity On the day of discharge, go home and rest. It is safe to resume normal daily activity (walking, climbing stairs). Use handrails when climbing stairs, as you may feel slightly weak or unsteady for some time. If an activity causes you pain or fatigue, stop that activity for a few days and start back slowly when you resume.     Exercise Exercise is important in helping your body maintain strength and endurance, regaining abdominal muscle tone and returning to a healthy non-pregnant weight. Resume exercise as soon as you feel able (and your incision has healed, if you had a Cesarean delivery). Again, if aerobic exercise causes you pain or fatigue, stop for a few days and start back slowly when you resume.     When you resume aerobic exercise, ensure you are staying adequately hydrated (urine should be light yellow to clear) and consider breastfeeding just prior to exercise in order to avoid breast discomfort during physical activity.     Pelvic floor exercises (also known as Kegal exercises) can and should be initiated as soon as you can comfortably perform them to enhance healing and strengthening of your pelvic  floor (for both vaginal and Cesarean deliveries).    Driving Do not drive until you are alert, able to move quickly without pain, and no longer taking narcotic pain medications.     Travel Travel is fine, but if on a long trip, get out and walk every 2 hours to maintain circulation. Often your feet and ankles will become swollen during the week after delivery. If you had IV fluids, you may swell for several weeks.      Emotional Changes It is normal to experience some mild sadness or emotional swings in the first week or two after delivery. If your symptoms do not get better after 2 weeks or you are experiencing worsening sadness, mood swings, thoughts of hurting yourself or others, including your baby, please call us right away. Postpartum depression is real and treatable, but it is easier to treat the earlier it is detected.    Employment You may return to work 6 weeks after delivery. This is standard [place].    Stitches and/or Hemorrhoids Keep the perineal area clean with baths, showers or Sitz baths. Americaine, Dermoplast, or similar topical treatments are available over the counter and can be applied as needed to stitches. If you have lacerations, they will heal within the next few weeks and the stitches will dissolve on their own. Preparation H, Anusol and Tucks are fine for hemorrhoids.     Breast Care Breast feeding takes patience and perseverance. Remember, this is a new learning experience for you and your baby. For the  first few weeks, your baby may nurse every 2-3 hours. Good nutrition, fluids and rest are important for your own well being, so try to rest as much as possible in between feedings. If the baby doesn't empty your breasts and they become engorged, you may express the rest of the milk. Nipple tenderness or soreness, peaks at 5-7 days of nursing. To correct this, check the areola (pigmented area around breasts), not just the nipple. Letting a little breast milk dry on the exposed  nipple and applying ice packs to nipples are both effective ways to help healing. PurLan (or any lanolin cream/ointment) and Soft [place] Fuel also help. Clogged ducts (feel like stringy clumps) can be relieved with massage and a warm shower or wash cloth on the breast prior to nursing.     If you are bottle feeding, a snug bra will be helpful in preventing engorgement. If engorgement does occur, apply ice packs to breasts and medicate with Ibuprofen. This must be done continuously for several days. We do not use medication of any kind to "dry up" your breast milk.      Breast infections can occur whether you are nursing or not. Symptoms include fever, chills, painful area on your breast. Usually antibiotics are needed, so call us during the day with your pharmacy number. If you need to use pain medications other than what we list in these instructions, check with your OB provider or pediatrician's office to determine if they are safe for breastfeeding.      Abdominal Cramps "After birth pains" can be severe, especially with nursing and after the birth of the second or more child. They rarely last more than 4 days.     Pain Medication Ibuprofen or acetaminophen are both safe to take while nursing and are usually effective at managing cramps.     Vitamins and Iron Continue prenatal vitamins throughout nursing or for 1 month if not nursing.     If your doctor instructed you to take iron, you should take it with 500 mg Vitamin C. You should continue the iron for 3 months.    If your doctor instructed you to take Vitamin D, you should take at least 600 international units per day.    Bathing Showers and Sitz (shallow, lukewarm) baths are fine. Wait until your stitches/tears completely heal (for vaginal deliveries) or your incision has completely closed (Cesarean deliveries) before taking deep baths or swimming. We recommend no douching.     Constipation Stool softeners such as Colace, Metamucil, Senokot or generic are fine  to use for mild constipation. Use milk of magnesia as necessary for persistent constipation.       Menstruation Bleeding is expected for several weeks and up to 12 weeks is not abnormal. It may be red, blackish with clots. It changes to pink and then yellow-gray. Frequently, the discharge becomes red again. If the bleeding gets heavy again, you may need to rest more.     The return of a real menstrual period varies. For non-nursing mothers, it is usually between 4-10 weeks. For nursing mothers, it can take over a year. Several months of periods are usually required before cycles become regular.     Sexual Intercourse It takes varying times for stitches/tears to heal and for hemorrhoids and swelling to go away. Nursing also causes vaginal dryness and lubrication is often required. If your stitches/tears have healed and your bleeding has decreased, vaginal intercourse can be resumed as early as 2 weeks after delivery,  as long as you feel emotionally and physically ready. Remember, the possibility of becoming pregnant exists, even while nursing. You ovulate 2 weeks before the return of the first period, so you may become pregnant before your first period.      Contraception We recommend at least 18 months between pregnancies for you and your baby's health. Even if you are breastfeeding, you can become pregnant if you are not using a reliable and consistent form of contraception. We do not recommend using estrogen-containing products for the first several weeks after delivery due to the elevated risk of blood clots in your legs or lungs. Progesterone-only products (e.g., mini-pill or Depo-Provera), however, are perfectly safe immediately postpartum and do not interfere with breastfeeding.     Postpartum Visit Call your doctor's office to schedule your appointment. This visit will be scheduled 4-6 weeks after you delivered.      After a postpartum tubal ligation... Keep your incision clean and dry. When you shower, let  the soapy water run over the incision; when you get out of the shower, use a clean, dry towel to pat the incision dry. You have dissolvable sutures that do not need to be removed.     You may require narcotic medication for a couple of days. You should not drive while taking these medications.     Wait until your incision is completely closed before taking deep baths or swimming.    You should not lift anything greater than 15 lbs or initiate abdominal wall exercises until after your incision is healed.    Call your doctor or midwife if you experience any of the following  1. Fever of 101 degrees or severe chills. Check your temperature before calling, if possible. You may feel hot, but you cannot determine if you have a true fever without a thermometer.   2. Excessively heavy vaginal bleeding (completely soaking through 1 pad or more per hour).  3. Extreme urinary frequency and burning with urination.  4. Swelling or tenderness in one area of the breast.  5. Severe headache that does not resolve with medication.   6. Chest pain or difficulty catching your breath that does not go away.  7. Severe abdominal pain, especially high up on your right side.   8. Severe swelling of your arms or legs OR swelling in only one leg.  9. Marked depression or anxiety.   10. Redness, worsening tenderness or drainage around your incision, or if you notice an opening in your incision.

## 2016-02-06 NOTE — Progress Notes (Signed)
OBSTETRICS VAGINAL DELIVERY PROGRESS NOTE   Postpartum Day: 1    Subjective     Carrie Velazquez is doing well.  Pain is well-controlled with current regimen.  Tolerating regular diet without nausea/vomiting.  She has ambulated.  Denies chest pain, SOB, or lightheadedness.  Appropriate vaginal bleeding.  Baby is doing well.      Objective     Vitals:    02/05/16 1434 02/05/16 1949 02/06/16 0045 02/06/16 0510   BP: 118/67 94/50 121/59 107/56   Pulse: 80 87 78 88   Resp: 16 16 16 16    Temp: 37.5 C (99.5 F) 37.2 C (99 F) 36.1 C (97 F) 36 C (96.8 F)   TempSrc: Oral Temporal Temporal Temporal   SpO2: 96% 95% 100% 99%   Weight:       Height:           Mental Status: Alert and oriented x 3  Cardiovascular: Regular rate and rhythm with no murmurs  Respiratory: Clear to auscultate  Abdomen: Soft, appropriately tender, nondistended, +BS  Fundus: Fundus firm at umbilicus-2  Neurological: Not assessed  Extremities/Skin: No edema noted      Labs       Recent Labs  Lab 02/06/16  0519 02/04/16  1500   Hematocrit 26* 28*       ABO RH Blood Type (no units)   Date Value   02/04/2016 A RH POS                  Rubella IgG AB (no units)   Date Value   07/06/2015 POSITIVE         Assessment & Plan     Carrie Velazquez is a 25 y.o. [id number] on PPD# 1 s/p Vaginal, Spontaneous Delivery  at [redacted]w[redacted]d  Doing well.    Routine postpartum care:  - Rh status as above. Rhogam is not indicated.    - Infant: female. Desires circumcision.  - Feeding: breast  - PPBC: PPBTL  - PPHct as above. Ferrous sulfate is indicated on discharge.  - Immunizations: MMR up to date; declined Tdap    Disposition: Plan for D/C home PPD # 2.    EGorden Harms MD  Obstetrics/Gynecology, PGY1  Pager #[phone]

## 2016-02-06 NOTE — Plan of Care (Signed)
Knowledge Deficit    • Patient/S.O. demonstrates understanding of disease process, treatment plan, medications, and discharge instructions. Maintaining        Mobility    • Patient's functional status is maintained or improved Maintaining        Nutrition    • Patient's nutritional status is maintained or improved Maintaining        Pain    • Patient's pain/discomfort is manageable Maintaining    • Report decrease in pain level Maintaining        Pain/Comfort    • Patient's pain or discomfort is manageable Maintaining        Psychosocial    • Demonstrates ability to cope with illness Maintaining        Safety    • Patient will remain free of falls Maintaining    • Prevent any intentional injury Maintaining        Safety - OB    • Follow unit's safety guidelines Maintaining        Vaginal Delivery - Recovery and Post Partum    • Vital signs are medically acceptable Maintaining    • Respiratory - Lungs clear to auscultation Maintaining    • Cardiovascular - Homan's sign negative Maintaining    • Patient will remain free of falls Maintaining    • Fundus firm at midline Maintaining    • Moderate rubra freeflow, no purulent discharge, no foul smelling lochia Maintaining    • Empties bladder Maintaining    • Verbalizes understanding of normal bowel function resumption Maintaining    • Edema will be absent or minimal Maintaining    • Breasts are soft with nipple integrity intact Maintaining    • Demonstrates appropriate breast feeding techniques Maintaining    • Appropriate behavior observed Maintaining    • Positive Mother-Baby interactions are observed Maintaining    • Perineum intact without discharge or hematoma Maintaining    • Ambulates independently Maintaining

## 2016-02-06 NOTE — Anesthesia Case Conclusion (Signed)
CASE CONCLUSION  Emergence  Criteria Used for Airway Removal:  Adequate Tv & RR and acceptable O2 saturation  Assessment:  Routine  Transport  Directly to: PACU  Airway:  Nasal cannula  Oxygen Delivery:  2 lpm  Position:  Recumbent  Patient Condition on Handoff  Level of Consciousness:  Alert/talking/calm  Patient Condition:  Stable  Handoff Report to:  RN

## 2016-02-06 NOTE — Op Note (Addendum)
OPERATIVE NOTE    Date of Surgery:  02/06/16  Surgeon:  Jamey ReasKatina Karrigan Messamore, MD  First Assistant:  Mathews RobinsonsJessica Mitchell, MD  Second Assistant:  Maurine MinisterPhoebe Whalen, MD    Pre-Op Diagnosis:    1. Patient desires permanent surgical sterilization.  2. Postpartum    Anesthesia Type:  Spinal    Medications received in the OR:  None     Post-Op Diagnosis:  1. Patient desires permanent surgical sterilization.  2. Postpartum    Procedure(s) Performed:  1. Postpartum bilateral tubal ligation    Estimated Blood Loss:  5 cc  Packing:  No  Drains:  No  Fluid Totals:  Intakes:  400 cc      Output:  N/a   Specimens to Pathology:  Yes bilateral fallopian tubes  Patient Condition:  good    Indications:   Patient is a 25 y.o. 62P2002 female s/p spontaneous vaginal delivery on 02/05/16 who desires surgical sterilization.  She was extensively counseled regarding the risks, permanency, efficacy and failure rate of the procedure and has consented.     Operative Findings:  Normal appearing fallopian tubes and ovaries bilaterally.    Description of Procedure:  The patient was taken to the operating room, placed on the operating room table and a stop check was performed confirming the patient and procedure to be performed.  Spinal anesthesia was obtained without difficulty.  The patient's abdomen was prepped and draped in the usual sterile fashion.  The surgical pause was completed.  A 4 cm infraumbilical skin incision was made and carried down sharply through the fascia.  The peritoneum was isolated and incised.        Next the right fallopian tube was identified and traced to its fimbriated end.  An avascular portion of the mesosalpinx was identified and a mid-segment of the tube was grasped and carefully elevated with a babcock clamp.  The mesosalpinx was pierced in an avascular plane and free ties were used to ligate the tube on either side of the babcock.  The mid-segment portion was then excised, labeled and sent to pathology.  0 plain gut suture was  used to ligate the free ends of the tube.  Good hemostasis was noted.  The left was then identified and traced to its fimbriated end and the exact same procedure was carried out on the left side, again with good hemostasis noted.    A wound sweep was performed noting no retained instruments or sponges.  There was good hemostasis noted at the end of the procedure.  The fascia was then re-approximated with 0 vicryl.  The skin was re-approximated with 4-0 moocryl in the standard fashion.  A sterile dressing was applied.  All sponge, needle and instrument counts were correct x2 at the end of the procedure.    The patient tolerated the procedure well, anesthesia was reversed and she was transferred to the Post Anesthesia Care Unit in stable and satisfactory condition.      Maurine MinisterPhoebe Whalen, MD  OB/GYN Resident, PGY-3  Pager [phone]    02/06/2016  1:38 PM      Signed:  Anastasia PallPhoebe Smitasin Whalen  on 02/06/2016  at 1:38 PM    I was present throughout the entire procedure.    Jamey ReasKatina Jeffry Vogelsang, MD

## 2016-02-06 NOTE — Progress Notes (Signed)
OBSTETRICS VAGINAL DELIVERY PROGRESS NOTE   Postpartum Day: 1  Postop Day: 0    Subjective     Carrie Velazquez is doing ok this afternoon. Felt a bit lightheaded after getting up to the bathroom. States her butt still feels numb from the spinal, but otherwise her pain is well-controlled with current regimen.  Tolerating regular diet without nausea/vomiting.  She has ambulated.  Denies chest pain, SOB, or lightheadedness.  Appropriate vaginal bleeding.  Baby is doing well.      Objective     Vitals:    02/06/16 1345 02/06/16 1400 02/06/16 1430 02/06/16 1530   BP: 96/59 99/66 114/60 123/64   BP Location:       Pulse: 69 61 66 60   Resp: 12 12 16 16    Temp:  37 C (98.6 F) 36 C (96.8 F) 37.2 C (99 F)   TempSrc:  Temporal Temporal Temporal   SpO2: 98% 100% 100% 99%   Weight:       Height:           Mental Status: Alert and oriented x 3  Cardiovascular: Regular rate and rhythm with no murmurs  Respiratory: Clear to auscultate  Abdomen: Soft, appropriately tender, nondistended, +BS  Fundus: Fundus firm at umbilicus-2  Incision: C/D/I with dermabond  Neurological: grossly intact  Extremities/Skin: No edema noted      Labs       Recent Labs  Lab 02/06/16  0519 02/04/16  1500   Hematocrit 26* 28*       ABO RH Blood Type (no units)   Date Value   02/04/2016 A RH POS                    Rubella IgG AB (no units)   Date Value   07/06/2015 POSITIVE         Assessment & Plan     Carrie Velazquez is a 24 y.o. G2P2002 on PPD# 1 s/p Vaginal, Spontaneous Delivery  at [redacted]w[redacted]d and POD#0 from PP BTL.  Doing well.    Routine postpartum care:  - Rh status as above. Rhogam is not indicated.    - Infant: female. Desires circumcision.  - Feeding: breast  - PPBC: s/p PPBTL  - PPHct as above. Ferrous sulfate is indicated on discharge.  - Immunizations: MMR up to date; declined Tdap    Postop care  - s/p toradol  - percocet for pain    Disposition: Plan for D/C home PPD # 2.    PReece Levy MD  OB/GYN Resident, PGY-3  Pager #[phone]

## 2016-02-07 ENCOUNTER — Encounter: Payer: Self-pay | Admitting: Obstetrics and Gynecology

## 2016-02-07 LAB — SURGICAL PATHOLOGY

## 2016-02-07 NOTE — Anesthesia Postprocedure Evaluation (Signed)
Anesthesia Post-Op Note    Patient: Carrie Velazquez    Procedure(s) Performed:  Procedure Summary     Date Anesthesia Start Anesthesia Stop Room / Location    02/06/16 1209 1324 [id number] / [facility] MAIN OR       Procedure Diagnosis Surgeon Attending Anesthesia    PPBTL (N/A Abdomen) General counseling and advice on female contraception  (General counseling and advice on female contraception [Z30.09]) Jamey ReasFoster, Katina, MD Launa Flightran, Caila Cirelli Hai, MD        Recovery Vitals  BP: 113/59 (02/07/2016  3:20 AM)  Heart Rate: 68 (02/07/2016  3:20 AM)  Heart Rate (via Pulse Ox): 68 (02/06/2016  2:00 PM)  Resp: 18 (02/07/2016  3:20 AM)  Temp: 36.4 C (97.5 F) (02/07/2016  3:20 AM)  SpO2: 98 % (02/07/2016  3:20 AM)  O2 Device: None (Room air) (02/07/2016  3:20 AM)   0-10 Scale: 3 (02/06/2016 11:45 PM)  Anesthesia type:  Spinal  Complications Noted During Procedure or in PACU:  None   Comment:    Patient Location:  Labor and Delivery  Level of Consciousness:    Recovered to baseline, awake, alert and oriented  Patient Participation:     Able to participate  Temperature Status:    Normothermic  Oxygen Saturation:    Within patient's normal range  Cardiac Status:   Within patient's normal range  Fluid Status:    Stable  Airway Patency:     Yes  Pulmonary Status:    Baseline  Neuraxial Block Evaluation:    No residual motor or sensory symptoms  Pain Management:    Adequate analgesia  Nausea and Vomiting:  None    Post Op Assessment:    Tolerated procedure well   Attending Attestation:  All indicated post anesthesia care provided       Complications Noted During Recovery Period:      None  Recovery from Anesthesia:      Recovered to baseline level of consciousness  Condition of patient:      Recovered to pre-anesthetic condition

## 2016-02-07 NOTE — Lactation Note (Signed)
This note was copied from a baby's chart.  [facility] Lactation   Mom has decided to pump and bottle feed. Has a used borrowed pump. R/W mom how to get a new pump through insurance. Also r/w mom breast milk storage, frequency of pumping, and engorgement. Has LC contact information

## 2016-02-07 NOTE — Progress Notes (Signed)
Postpartum Note CNM Attending A+ RI  S: Patient currently PPD#2 s/p spontaneous vaginal  delivery and states she is doing well. Pt is currently bottle and breasfeeding infant with and without issue. Pt reports Minimal lochia without clots. Pain is well controlled but is having gas pain from pp BTL yesterday. She is ambulating, voiding and tolerating po intake without difficulty.     Pt plans BTL and had done yesterday for [facility] postpartum. Pt reports currently feeling well emotionally Pt is prepared for infant at home. Currently living with family.     Abuse/Neglect Screen:  No    O:  Patient Vitals for the past 24 hrs:   BP Temp Temp src Pulse Resp SpO2   02/07/16 0903 121/63 36.6 C (97.9 F) TEMPORAL 107 18 96 %   02/07/16 0320 113/59 36.4 C (97.5 F) TEMPORAL 68 18 98 %   02/06/16 2345 99/51 36.5 C (97.7 F) TEMPORAL 88 18 96 %   02/06/16 1942 116/56 36.6 C (97.9 F) TEMPORAL 79 16 97 %   02/06/16 1720 120/60 37 C (98.6 F) TEMPORAL 65 16 98 %   02/06/16 1530 123/64 37.2 C (99 F) TEMPORAL 60 16 99 %   02/06/16 1430 114/60 36 C (96.8 F) TEMPORAL 66 16 100 %   02/06/16 1400 99/66 37 C (98.6 F) TEMPORAL 61 12 100 %   02/06/16 1345 96/59 - - 69 12 98 %   02/06/16 1330 97/60 - - 88 12 97 %   02/06/16 1322 (!) 88/58 36.9 C (98.4 F) TEMPORAL 97 12 100 %   02/06/16 1136 109/61 36.8 C (98.2 F) TEMPORAL 95 16 98 %       General appearance: healthy  Breasts: R-Soft and L-Soft  Abdomen/Fundus:  Fundus firm at umbilicus nontender  + tenderness on incision site  Abdomen softly distended  Perineum:Not assessed  Lochia:Minimal  Rectum:not examined  Extremities/Skin: Minimal edema of pedal and pretibial (1+)    Homans negativebilaterally    I/Os:  I/O last 3 completed shifts:  08/30 0700 - 08/31 0659  In: 895 (12 mL/kg) [I.V.:895 (0.5 mL/kg/hr)]  Out: 702 (9.4 mL/kg) [Urine:700 (0.4 mL/kg/hr); Blood:2]  Net: 193  Weight: 74.8 kg      LABS:  Recent Results (from the past 24 hour(s))   POCT glucose    Collection Time:  02/06/16 11:47 AM   Result Value Ref Range    Glucose POCT 77 60 - 99 mg/dL         A:   25 y.o. 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   PP d/c instructions from practice given and reviewed with pt. See orders and Patient Instructions    Med use reviewed encouraged to continue PNV's.  [facility] Reviewed proper use of method and potential side effects.   declines Tdap vaccine.     Circumcision consent discussed/reviewed/obtained yes and procedure done tdoay   May d/c pt home PPD#2 if pt remains in stable condition. Plan for PP exam in 4-6wks as discussed.    Berniece AndreasJacqueline Raymon Schlarb, CNM

## 2016-02-07 NOTE — Discharge Summary (Signed)
Admit date: 02/04/2016         Discharge date and time: No discharge date for patient encounter.  Admitting Physician: Clarene EssexMary Li Ma, MD   Discharge Physician: Berniece AndreasJacqueline Mayan Kloepfer, CNM     History of Present Illness:   Carrie Velazquez is a 25 y.o. G2P2002 at 5220w4d weeks and estimated date of delivery of 02/08/2016, by Last Menstrual Period who presented with Scheduled Induction   She has Cystic fibrosis carrier; Psoriasis; Thyroid nodule; Prenatal care; LGA (large for gestational age) fetus affecting mother, antepartum; Anemia; Pain in symphysis pubis during pregnancy; Polyhydramnios; SVD (spontaneous vaginal delivery); and Status post tubal ligation on her problem list.    OB History   Gravida Para Term Preterm AB Living   2 2 2   2    SAB TAB Ectopic Multiple Live Births      0 2      # Outcome Date GA Lbr Len/2nd Weight Sex Delivery Anes PTL Lv   2 Term 02/05/16 9120w4d / 00:39 3884 g (8 lb 9 oz) M Vag-Spont EPIDURAL- N LIV   1 Term 12/06/10 7931w3d 06:39 / 01:05 3561 g (7 lb 13.6 oz) F Vag-Spont EPI,Local N LIV           Carrie Delayikki Amos  has a past medical history of Anemia (HCT of 30 4/12); Constipation in pregnancy (05/20/10); Cystic fibrosis carrier (04/22/10); Genital warts complicating pregnancy (08/07/10); Nausea/vomiting in pregnancy (11/11); Psoriasis (04/22/10); Rubella non-immune status (04/22/10); Unplanned pregnancy (11/11); and Varicella. She  has no past surgical history on file. Lowella Bandyikki has No Known Allergies (drug, envir, food or latex). Her family history includes Brain cancer in an other family member; Cancer in her maternal grandmother; Lung cancer in an other family member; Other in her father. She  reports that she has never smoked. She has never used smokeless tobacco. She reports that she does not drink alcohol or use illicit drugs..    Admission Diagnoses: term labor , induction for polyhdramnios  Discharge Diagnoses: normal vaginal birth  PP BTL  Information for the patient's newborn:  Danielle RankinMiller, Boy [[id number]][[id number]]    @tob @         Admission Condition: good  Discharged Condition: good    Hospital Course:   Uncomplicated Labor and Delivery, progressed normally, had pp BTL on 02/06/16, recovering from this, incision clean and dry, will f/u fo pp check at 6 weeks, declines Tdap vaccine    Hospital Problem List:  ACTIVE    Diagnosis Date Noted    *!*SVD (spontaneous vaginal delivery) [O80] 02/05/2016    Status post tubal ligation [Z98.51] 02/06/2016      RESOLVED    Diagnosis Date Noted Date Resolved    Pregnancy [Z33.1] 02/04/2016 02/04/2016    Term pregnancy [Z34.80] 02/04/2016 02/05/2016       Author: Berniece AndreasJacqueline Anayah Arvanitis, CNM  Note created: 02/07/2016  at: 10:07 AM

## 2016-02-07 NOTE — Progress Notes (Signed)
Social Work  Contacts:  Carrie BandyNikki, FOB, [facility]  Summary: Carrie Velazquez needed a rolling walker for discharge.  [facility]  Carrie Velazquez and husband are eager to go home.  Carrie Velazquez admits she is a bit discouraged by her inability to independently ambulate.  Plan:  As above.  Glenna FellowsKaren Edilson Velazquez, LCSWR

## 2016-02-12 ENCOUNTER — Encounter: Payer: Self-pay | Admitting: Obstetrics and Gynecology

## 2016-02-15 ENCOUNTER — Encounter: Payer: Self-pay | Admitting: Obstetrics and Gynecology

## 2016-02-15 ENCOUNTER — Ambulatory Visit: Payer: Commercial Managed Care - PPO | Attending: Obstetrics and Gynecology | Admitting: Obstetrics and Gynecology

## 2016-02-15 VITALS — BP 110/70 | Temp 98.1°F | Ht 62.99 in | Wt 145.0 lb

## 2016-02-15 DIAGNOSIS — Z9889 Other specified postprocedural states: Secondary | ICD-10-CM

## 2016-02-15 NOTE — Progress Notes (Signed)
Obstetric Clinic Incision Check    CC: incision check from a PP BTL    HPI: Carrie Velazquez is a 25 y.o. 602P2002 female who presents to clinic today for an incision check for her PPBTL. Doing well. No fever or chills. No nausea or vomiting.     PMH, PSH, SH, FH reviewed and updated as appropriate.    Current Medications  Prior to Admission medications    Medication Sig Start Date End Date Taking? Authorizing Provider   oxyCODONE-acetaminophen (PERCOCET) 5-325 MG per tablet Take 1 tablet by mouth every 4 hours as needed   Max daily dose: 6 tablets 02/06/16   Maurine MinisterWhalen, Phoebe Smitasin   ibuprofen (ADVIL,MOTRIN) 600 MG tablet Take 1 tablet (600 mg total) by mouth 3 times daily as needed for Pain 02/06/16 03/07/16  Maurine MinisterWhalen, Phoebe Smitasin   ferrous sulfate 325 (65 FE) MG tablet Take 1 tablet (325 mg total) by mouth 2 times daily (with meals) 11/21/15   Harcleroad, Tammy A, NP   NONFORMULARY, OTHER, ORDER *I* Pregnancy support belt 10/04/15   Judith BlonderIbrahim, Aurthur Wingerter, MD       Physical Exam  Vitals:    02/15/16 1254   BP: 110/70   Temp: 36.7 C (98.1 F)   Weight: 65.8 kg (145 lb)   Height: 1.6 m (5' 2.99")       General - alert, well appearing, and in no distress  Abdomen - Soft, non-tender, normoactive bowel sounds  Incision - C/D/I    Assessment/Plan  25 y.o. [id number] woman on here for incision check for PP BTL.     Return to clinic 5 weeks for complete postpartum visit.      Judith BlonderMarwa Ottis Vacha, MD  02/15/2016

## 2016-03-24 ENCOUNTER — Ambulatory Visit: Payer: Commercial Managed Care - PPO | Attending: Obstetrics and Gynecology | Admitting: Obstetrics & Gynecology

## 2016-03-24 ENCOUNTER — Encounter: Payer: Self-pay | Admitting: Obstetrics & Gynecology

## 2016-03-24 DIAGNOSIS — Z9851 Tubal ligation status: Secondary | ICD-10-CM

## 2016-03-24 NOTE — Patient Instructions (Signed)
Healthy Practices for Women of All Ages     In addition to your regular OB/GYN history, physical, cholesterol screening, Pap smear, and other recommended cancer screening tests, here is a list of healthy practices.  Modifying your daily activities according to these practices may improve your overall health and well-being.  In the event of questions about this list, ask your doctor or health care provider.      Diet and Exercise:  - Emphasize lean protein (poultry, fish, eggs), vegetables, and fruit.  Fresh, whole foods are ideal for maintaining nutrition.    - Consider adding a multivitamin that contains calcium and vitamin D, especially if you do not eat dairy products.  - Add folic acid supplementation 0.4 mg (400 micrograms, present in most daily multivitamins) at least two months before considering pregnancy to reduce the risks of birth defects.  - Participate in regular exercise for 30 minutes at least five times a week, and consider weight training to improve bone density and assist with weight loss.    o If you have a smartphone, the app Sworkit is available for free and can be used to develop exercise routines that do not require any additional equipment.  - If you are trying to lose weight, please talk to your health care provider about different weight loss strategies.    o If you have a smartphone, the app MyFitnessPal is available for free and can help you track your diet and exercise.    Injury Prevention:  - Seat belts should be worn while in a moving car.  - A helmet should be used when using motorcycles, bicycles, roller blades and ATV’s or skiing.  - Place approved smoke detectors in your house and replace the batteries twice a year.    - Guns and other firearms should be stored unloaded and in a locked area with trigger locks.  - Consider CPR training for household members.    Dental Health:  - Schedule regular visits to the dentist - ideally, every 6 months.  - Floss and brush with fluoride  toothpaste daily.    Immunizations:  - A tetanus/diphtheria booster shot (d/T) is recommended every 10 years.  - An MMR vaccine is recommended for non-pregnant women born after 1958 without proof of immunity of documentation of previous immunization.  Adults who are susceptible to varicella (chicken pox) should be vaccinated.  - Influenza vaccine is indicated yearly.  Pregnant women and people who are around anyone with a suppressed immune system should always receive a flu vaccine.  - Pneumococcal pneumonia vaccine is indicated for age 65 and older once in your life.  - Hepatitis A and/or B vaccines are recommended for high-risk individuals.    Substance Abuse:  - Stop smoking; do not use any other tobacco products.  - Avoid alcohol use when driving, boating, swimming or operating other machinery. Avoid excessive use of alcoholic beverages.  Limit alcoholic beverages to 2 per sitting and no more than 7 seven per week.  - If you are struggling with alcohol use, talk to your provider about a referral for treatment.  - Recreational drug use (marijuana, cocaine, etc.) is dangerous and should be avoided.  If you are having issues with addiction, please talk to your health care provider about getting help.    Sexual Behavior:  - Be sure to use contraception if pregnancy is not desired.  Your health care provider can given you information about different contraceptive options.    - Regular use   of female or female condoms with spermicide helps prevent sexually transmitted infections.  It is recommended to have screening for sexually transmitted diseases like gonorrhea and chlamydia annually.  - Annual HIV testing is also recommended.  In addition, consider more frequent testing if you have had more than one partner or have used intravenous drugs.    Breast Health:  - Breast self exams are not routinely recommended, but if you notice a change in the appearance of your breasts, please talk to your health care provider.  - A  mammogram should be done once between ages 61-40, then every 1-2 years based on risk factors.  If there is a strong family history of premenopausal breast cancer, you should start with mammograms earlier than age 88.    Colon Cancer Surveillance:  - Colonoscopy is recommended starting at age 17, but it might be recommended earlier if you have risk factors like family history.  If a colonoscopy is normal, it is repeated about every 10 years.  - If you are not interested in a colonoscopy, stool occult blood screening should be done annually and/or sigmoidoscopy (scope examination of the colon) every 3-5 years.

## 2016-03-24 NOTE — Progress Notes (Signed)
[facility] Post[facility]    SUBJECTIVE   25 y.o. yo [id number] who is s/p SVD on 8/29 who presents for a postpartum visit.  She had a postpartum tubal ligation in the hospital.  Her last period was really heavy.  She is concerned that is a new normal for her.  That was her first period after having the baby.  She started working last Saturday - works nights.  She and her partner are coordinating a new routine for getting up with baby at night.    She is bottle feeding.  No issues with breast tenderness etc after lactation.    Her depression screen is 0.    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    OBJECTIVE  Vitals:    03/24/16 0918   BP: 100/60   Weight: 64.9 kg (143 lb)   Height: 1.6 m (5' 2.99")       EXAM  General: well appearing, no distress  Cardio: regular rate and rhythm  Resp: clear to ausculation bilaterally  Abdomen: nontender, BTL incision well healed    ASSESSMENT/PLAN  25 y.o. [id number] here for postpartum visit.  Reviewed that menses may return to normal after a couple months - will continue to monitor.  Discussed strategies for taking care of newborn at night.      Routine health maintenance:  - Pap due in 2020    Return to clinic annually or as needed.    Johnnette LitterHaley Herminio Kniskern MD  OB/GYN R4  03/24/2016 11:19 AM   Pager [phone]

## 2016-04-15 ENCOUNTER — Telehealth: Payer: Self-pay

## 2016-04-15 NOTE — Telephone Encounter (Signed)
A [facility] received [facility] with a walker requisition for the provider to sign, for "abnormalities of gait". [facility] she says that she already got a walker right after her delivery. [facility], they still need the requisition signed.

## 2016-05-26 ENCOUNTER — Ambulatory Visit: Payer: Commercial Managed Care - PPO | Admitting: Obstetrics and Gynecology

## 2017-01-29 ENCOUNTER — Encounter: Payer: Self-pay | Admitting: Obstetrics and Gynecology

## 2017-01-29 ENCOUNTER — Ambulatory Visit: Payer: Commercial Managed Care - PPO | Attending: Obstetrics and Gynecology | Admitting: Obstetrics and Gynecology

## 2017-01-29 VITALS — BP 115/78 | Ht 62.99 in | Wt 135.0 lb

## 2017-01-29 DIAGNOSIS — Z3009 Encounter for other general counseling and advice on contraception: Secondary | ICD-10-CM

## 2017-01-29 DIAGNOSIS — N8110 Cystocele, unspecified: Secondary | ICD-10-CM

## 2017-01-29 LAB — POCT URINE PREGNANCY: Lot #: 7030003

## 2017-01-29 LAB — POCT URINALYSIS DIPSTICK
Blood,UA POCT: NEGATIVE
Glucose,UA POCT: NORMAL mg/dL
Ketones,UA POCT: NEGATIVE mg/dL
Leuk Esterase,UA POCT: NEGATIVE
Lot #: 27638202
Nitrite,UA POCT: NEGATIVE
PH,UA POCT: 7 (ref 5–8)
Protein,UA POCT: NEGATIVE mg/dL

## 2017-01-29 NOTE — Progress Notes (Signed)
[facility] Gynecology Visit  Location: [facility]     Subjective     Carrie Velazquez is a 26 y.o. [phone] who presents Is coming today because she felt a bulge coming out of her vagina.  She denies any pain at this time.  She denies any abnormal vaginal discharge.  She reports her LMP  was this month and was within normal limits.  She also reports leaking of urine, sneezing coughing and laughing.  She taper her first daughter.  She reportedly Hearing has not changed since last 6 years.          Patient's medications, allergies, past medical, surgical, obstetrical, gynecologic, social and family histories were reviewed and updated as appropriate.    General ROS: negative   Respiratory ROS: no cough, shortness of breath, or wheezing  Cardiovascular ROS: no chest pain or dyspnea on exertion  Gastrointestinal ROS: no abdominal pain, change in bowel habits, or black or bloody stools  Urinary ROS: no dysuria, trouble voiding, or hematuria  Musculoskeletal ROS: negative    Objective     BP 115/78   Ht 1.6 m (5' 2.99")   Wt 61.2 kg (135 lb)   BMI 23.92 kg/m2     General: well developed and well nourished  Abdomen: soft, non-tender; bowel sounds normal; no masses,  no organomegaly    Pelvic:   VULVA: normal appearing vulva with no masses, tenderness or lesions  VAGINA: normal appearing vagina with normal color and discharge, no lesions.  There is a grade 1 cystocele, no rectocele, no apical prolapse.  CERVIX: normal appearing cervix without discharge or lesions  UTERUS: uterus is normal size, shape, consistency and nontender  ADNEXA: normal adnexa in size, nontender and no masses         Assessment     Carrie Velazquez is an 26 y.o. [id number] presenting to the office for an acute GYN visit.     1. Family planning  POCT urine pregnancy   2. Dysuria  POCT urinalysis dipstick         Plan     Cystocele:  -Patient was found to have a grade 1 cystocele.  -The patient reported that she wanted to come  to the office to make sure everything was fine.  -Patient was explained that pelvic organ prolapse is treated based on symptoms and is a quality of life issue.  The patient was recommended to do Kegel exercises at this time.  The patient was explained that is her decision she would like to pursue further treatment of this.  -At this time the patient reports no complaints and would like to continue with expectant management.    Dispo:  She will follow-up in 3 months for annual GYN examination.    25 min face to face time spent with the patient; >50% of this time was spent on counseling and coordination of care.    Augustin Coupe, [place] 01/29/2017 12:28 PM

## 2018-06-17 ENCOUNTER — Encounter: Payer: Self-pay | Admitting: Obstetrics and Gynecology

## 2019-01-21 ENCOUNTER — Emergency Department (HOSPITAL_BASED_OUTPATIENT_CLINIC_OR_DEPARTMENT_OTHER): Payer: No Typology Code available for payment source

## 2019-01-21 ENCOUNTER — Emergency Department (HOSPITAL_BASED_OUTPATIENT_CLINIC_OR_DEPARTMENT_OTHER)
Admission: EM | Admit: 2019-01-21 | Discharge: 2019-01-21 | Disposition: A | Discharge status: Home / Self Care | Payer: No Typology Code available for payment source | Attending: Emergency Medicine | Admitting: Emergency Medicine

## 2019-01-21 DIAGNOSIS — M791 Myalgia, unspecified site: Secondary | ICD-10-CM

## 2019-01-21 DIAGNOSIS — Z79899 Other long term (current) drug therapy: Secondary | ICD-10-CM | POA: Diagnosis not present

## 2019-01-21 DIAGNOSIS — M7918 Myalgia, other site: Secondary | ICD-10-CM | POA: Insufficient documentation

## 2019-01-21 DIAGNOSIS — R200 Anesthesia of skin: Secondary | ICD-10-CM | POA: Diagnosis present

## 2019-01-21 LAB — PREGNANCY, URINE: Preg Test, Ur: NEGATIVE

## 2019-01-21 MED ORDER — METHOCARBAMOL 500 MG PO TABS
500.0000 mg | ORAL_TABLET | Freq: Once | ORAL | Status: AC
  Administered 2019-01-21: 500 mg via ORAL
  Filled 2019-01-21: qty 1

## 2019-01-21 MED ORDER — NAPROXEN 500 MG PO TABS: 500.0000 mg | ORAL_TABLET | Freq: Two times a day (BID) | ORAL | 0 refills | Status: AC | PRN

## 2019-01-21 MED ORDER — METHOCARBAMOL 500 MG PO TABS: 500.0000 mg | ORAL_TABLET | Freq: Two times a day (BID) | ORAL | 0 refills | Status: AC | PRN

## 2019-01-21 MED ORDER — ACETAMINOPHEN 500 MG PO TABS
1000.0000 mg | ORAL_TABLET | Freq: Once | ORAL | Status: AC
  Administered 2019-01-21: 1000 mg via ORAL
  Filled 2019-01-21: qty 2
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
## 2019-01-21 NOTE — Discharge Instructions (Signed)
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
## 2019-01-21 NOTE — ED Notes (Signed)
Patient encouraged to provide urine specimen at present time.
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
## 8387-02-08 DEATH — deceased
